# Patient Record
Sex: Female | Born: 1970 | Race: White | Hispanic: No | Smoking: Never smoker
Health system: Southern US, Community
[De-identification: ages and names within clinical notes are randomized; demographics above are authoritative.]

## PROBLEM LIST (undated history)

## (undated) DIAGNOSIS — Z789 Other specified health status: Secondary | ICD-10-CM

## (undated) HISTORY — PX: TUBAL LIGATION: SHX77

---

## 2015-06-07 DIAGNOSIS — M1990 Unspecified osteoarthritis, unspecified site: Secondary | ICD-10-CM | POA: Insufficient documentation

## 2015-06-07 DIAGNOSIS — F902 Attention-deficit hyperactivity disorder, combined type: Secondary | ICD-10-CM | POA: Insufficient documentation

## 2015-06-07 DIAGNOSIS — F5101 Primary insomnia: Secondary | ICD-10-CM | POA: Insufficient documentation

## 2015-10-13 ENCOUNTER — Other Ambulatory Visit: Payer: Self-pay | Admitting: Obstetrics and Gynecology

## 2015-10-13 DIAGNOSIS — Z1231 Encounter for screening mammogram for malignant neoplasm of breast: Secondary | ICD-10-CM

## 2015-10-18 ENCOUNTER — Ambulatory Visit: Admission: RE | Admit: 2015-10-18 | Payer: Self-pay | Source: Ambulatory Visit

## 2015-11-08 ENCOUNTER — Encounter
Admission: RE | Admit: 2015-11-08 | Discharge: 2015-11-08 | Disposition: A | Discharge status: Home / Self Care | Payer: BLUE CROSS/BLUE SHIELD | Source: Ambulatory Visit | Attending: Obstetrics and Gynecology | Admitting: Obstetrics and Gynecology

## 2015-11-08 DIAGNOSIS — N939 Abnormal uterine and vaginal bleeding, unspecified: Secondary | ICD-10-CM | POA: Diagnosis present

## 2015-11-08 DIAGNOSIS — N840 Polyp of corpus uteri: Secondary | ICD-10-CM | POA: Diagnosis present

## 2015-11-08 DIAGNOSIS — Z9851 Tubal ligation status: Secondary | ICD-10-CM | POA: Diagnosis not present

## 2015-11-08 DIAGNOSIS — Z800 Family history of malignant neoplasm of digestive organs: Secondary | ICD-10-CM | POA: Diagnosis not present

## 2015-11-08 DIAGNOSIS — Z833 Family history of diabetes mellitus: Secondary | ICD-10-CM | POA: Diagnosis not present

## 2015-11-08 DIAGNOSIS — N72 Inflammatory disease of cervix uteri: Secondary | ICD-10-CM | POA: Diagnosis not present

## 2015-11-08 LAB — BASIC METABOLIC PANEL
Anion gap: 6 (ref 5–15)
BUN: 12 mg/dL (ref 6–20)
CHLORIDE: 102 mmol/L (ref 101–111)
CO2: 26 mmol/L (ref 22–32)
Calcium: 8.8 mg/dL — ABNORMAL LOW (ref 8.9–10.3)
Creatinine, Ser: 0.71 mg/dL (ref 0.44–1.00)
GFR calc Af Amer: >60 mL/min (ref >60)
GFR calc non Af Amer: >60 mL/min (ref >60)
Glucose, Bld: 91 mg/dL (ref 65–99)
POTASSIUM: 3.8 mmol/L (ref 3.5–5.1)
SODIUM: 134 mmol/L — AB (ref 135–145)

## 2015-11-08 LAB — CBC
HEMATOCRIT: 39.6 % (ref 35.0–47.0)
Hemoglobin: 13.3 g/dL (ref 12.0–16.0)
MCH: 30.7 pg (ref 26.0–34.0)
MCHC: 33.5 g/dL (ref 32.0–36.0)
MCV: 91.7 fL (ref 80.0–100.0)
Platelets: 225 10*3/uL (ref 150–440)
RBC: 4.32 MIL/uL (ref 3.80–5.20)
RDW: 13.1 % (ref 11.5–14.5)
WBC: 7.9 10*3/uL (ref 3.6–11.0)

## 2015-11-08 LAB — TYPE AND SCREEN
ABO/RH(D): O POS
Antibody Screen: NEGATIVE

## 2015-11-08 LAB — PREGNANCY, URINE: Preg Test, Ur: NEGATIVE

## 2015-11-08 LAB — ABO/RH: ABO/RH(D): O POS

## 2015-11-08 NOTE — Patient Instructions (Signed)
  Your procedure is scheduled on:11/11/15 Report to Day Surgery. [facility] To find out your arrival time please call [phone] between 1PM - 3PM on 11/10/15  Remember: Instructions that are not followed completely may result in serious medical risk, up to and including death, or upon the discretion of your surgeon and anesthesiologist your surgery may need to be rescheduled.    __X__ 1. Do not eat food or drink liquids after midnight. No gum chewing or hard candies.     __X__ 2. No Alcohol for 24 hours before or after surgery.   ____ 3. Bring all medications with you on the day of surgery if instructed.    __X__ 4. Notify your doctor if there is any change in your medical condition     (cold, fever, infections).     Do not wear jewelry, make-up, hairpins, clips or nail polish.  Do not wear lotions, powders, or perfumes. You may wear deodorant.  Do not shave 48 hours prior to surgery. Men may shave face and neck.  Do not bring valuables to the hospital.    [facility] is not responsible for any belongings or valuables.               Contacts, dentures or bridgework may not be worn into surgery.  Leave your suitcase in the car. After surgery it may be brought to your room.  For patients admitted to the hospital, discharge time is determined by your                treatment team.   Patients discharged the day of surgery will not be allowed to drive home.   Please read over the following fact sheets that you were given:   Surgical Site Infection Prevention   ____ Take these medicines the morning of surgery with A SIP OF WATER:    1. NONE  2.   3.   4.  5.  6.  ____ Fleet Enema (as directed)   ____ Use CHG Soap as directed  ____ Use inhalers on the day of surgery  ____ Stop metformin 2 days prior to surgery    ____ Take 1/2 of usual insulin dose the night before surgery and none on the morning of surgery.   ____ Stop Coumadin/Plavix/aspirin on   ____  Stop Anti-inflammatories on    ____ Stop supplements until after surgery.    ____ Bring C-Pap to the hospital.

## 2015-11-08 NOTE — H&P (Signed)
  Ms. Denise Pratt is a 45 y.o. female here for Pre Op Consulting .  HPI:  Pt presents for a preoperative visit to schedule a D&C, hysteroscopy, and Novasure ablation for AUB failed medical management.  She has a hx of: BTL and polypectomy.  Workup has included: EMBx and TVUS, both normal   AUB - workup including normal EMBx and normal ultrasound. Has failed medical management, and desires permanent option.  Ut wnl Lt ov wnl Two rt simple ov cysts seen 1=1.09 cm 2=0.94 cm ES: 6.404mm  No fibroids  Hx of BTL Recent increase in moodiness, irritability. Some related to stopping her ADD meds, but she would like to trial an SSRI for the year to see if improvement.   Past Medical History:  has no past medical history on file.  Past Surgical History:  has a past surgical history that includes Tubal ligation and polypectomy. Family History: family history includes Colon cancer in her father; Diabetes mellitus in her father and paternal grandmother. Social History:  reports that she has never smoked. She does not have any smokeless tobacco history on file. She reports that she drinks alcohol. She reports that she does not use illicit drugs. OB/GYN History:  OB History    Gravida Para Term Preterm AB TAB SAB Ectopic Multiple Living   2 2 2       2       Allergies: has No Known Allergies. Medications:  Current Outpatient Prescriptions:  . sertraline (ZOLOFT) 50 MG tablet, Take 1 tablet (50 mg total) by mouth once daily., Disp: 30 tablet, Rfl: 11  Review of Systems: No SOB, no palpitations or chest pain, no new lower extremity edema, no nausea or vomiting or bowel or bladder complaints. See HPI for gyn specific ROS.  Exam:      Vitals:   11/08/15 1031  BP: 128/70    WDWN white female in NAD Body mass index is 33.3 kg/(m^2).  General: Patient is well-groomed, well-nourished, appears stated age in no acute distress  HEENT: head is atraumatic and  normocephalic, trachea is midline, neck is supple with no palpable nodules  CV: Regular rhythm and normal heart rate, no murmur  Pulm: Clear to auscultation throughout lung fields with no wheezing, crackles, or rhonchi. No increased work of breathing  Abdomen: soft , no mass, non-tender, no rebound tenderness, no hepatomegaly  Pelvic: tanner stage 5 ,  External genitalia: vulva /labia no lesions Urethra: no prolapse Vagina: normal physiologic d/c, laxity in vaginal walls Cervix: no lesions, no cervical motion tenderness, good descent Uterus: normal size shape and contour, non-tender Adnexa: no mass, non-tender  Rectovaginal: External wnl  Impression:   The primary encounter diagnosis was Abnormal uterine bleeding (AUB). A diagnosis of Encounter to establish care was also pertinent to this visit.    Plan:   - Preoperative visit: D&C hysteroscopy, Novasure ablation. Consents signed today. Risks of surgery were discussed with the patient including but not limited to: bleeding which may require transfusion; infection which may require antibiotics; injury to uterus or surrounding organs; intrauterine scarring which may impair future fertility; need for additional procedures including laparotomy or laparoscopy; and other postoperative/anesthesia complications. Written informed consent was obtained.  This is a scheduled same-day surgery. She will have a postop visit in 2 weeks to review operative findings and pathology.

## 2015-11-11 ENCOUNTER — Ambulatory Visit: Payer: BLUE CROSS/BLUE SHIELD | Admitting: Anesthesiology

## 2015-11-11 ENCOUNTER — Encounter: Payer: Self-pay | Admitting: *Deleted

## 2015-11-11 ENCOUNTER — Encounter
Admission: RE | Disposition: A | Discharge status: Home / Self Care | Payer: Self-pay | Source: Ambulatory Visit | Attending: Obstetrics and Gynecology

## 2015-11-11 ENCOUNTER — Ambulatory Visit
Admission: RE | Admit: 2015-11-11 | Discharge: 2015-11-11 | Disposition: A | Discharge status: Home / Self Care | Payer: BLUE CROSS/BLUE SHIELD | Source: Ambulatory Visit | Attending: Obstetrics and Gynecology | Admitting: Obstetrics and Gynecology

## 2015-11-11 DIAGNOSIS — N939 Abnormal uterine and vaginal bleeding, unspecified: Secondary | ICD-10-CM | POA: Insufficient documentation

## 2015-11-11 DIAGNOSIS — N72 Inflammatory disease of cervix uteri: Secondary | ICD-10-CM | POA: Diagnosis not present

## 2015-11-11 DIAGNOSIS — Z800 Family history of malignant neoplasm of digestive organs: Secondary | ICD-10-CM | POA: Insufficient documentation

## 2015-11-11 DIAGNOSIS — N840 Polyp of corpus uteri: Secondary | ICD-10-CM | POA: Insufficient documentation

## 2015-11-11 DIAGNOSIS — Z9851 Tubal ligation status: Secondary | ICD-10-CM | POA: Insufficient documentation

## 2015-11-11 DIAGNOSIS — Z833 Family history of diabetes mellitus: Secondary | ICD-10-CM | POA: Insufficient documentation

## 2015-11-11 HISTORY — PX: HYSTEROSCOPY WITH NOVASURE: SHX5574

## 2015-11-11 SURGERY — HYSTEROSCOPY WITH NOVASURE
Anesthesia: General | Wound class: Clean Contaminated

## 2015-11-11 MED ORDER — DEXAMETHASONE SODIUM PHOSPHATE 10 MG/ML IJ SOLN
INTRAMUSCULAR | Status: DC | PRN
  Administered 2015-11-11: 5 mg via INTRAVENOUS

## 2015-11-11 MED ORDER — LACTATED RINGERS IV SOLN
INTRAVENOUS | Status: DC
  Administered 2015-11-11: 07:00:00 via INTRAVENOUS

## 2015-11-11 MED ORDER — MIDAZOLAM HCL 2 MG/2ML IJ SOLN
INTRAMUSCULAR | Status: DC | PRN
  Administered 2015-11-11: 2 mg via INTRAVENOUS

## 2015-11-11 MED ORDER — OXYCODONE-ACETAMINOPHEN 5-325 MG PO TABS: 1.0000 | ORAL_TABLET | Freq: Four times a day (QID) | ORAL | Status: DC | PRN

## 2015-11-11 MED ORDER — PROPOFOL 10 MG/ML IV BOLUS
INTRAVENOUS | Status: DC | PRN
  Administered 2015-11-11: 150 mg via INTRAVENOUS

## 2015-11-11 MED ORDER — ONDANSETRON HCL 4 MG/2ML IJ SOLN: 4.0000 mg | Freq: Once | INTRAMUSCULAR | Status: DC | PRN

## 2015-11-11 MED ORDER — LIDOCAINE HCL (CARDIAC) 20 MG/ML IV SOLN
INTRAVENOUS | Status: DC | PRN
  Administered 2015-11-11: 100 mg via INTRAVENOUS

## 2015-11-11 MED ORDER — LACTATED RINGERS IV SOLN: INTRAVENOUS | Status: DC

## 2015-11-11 MED ORDER — DOCUSATE SODIUM 100 MG PO CAPS: 100.0000 mg | ORAL_CAPSULE | Freq: Two times a day (BID) | ORAL | Status: DC | PRN

## 2015-11-11 MED ORDER — ONDANSETRON HCL 4 MG/2ML IJ SOLN
INTRAMUSCULAR | Status: DC | PRN
  Administered 2015-11-11: 4 mg via INTRAVENOUS

## 2015-11-11 MED ORDER — FAMOTIDINE 20 MG PO TABS
20.0000 mg | ORAL_TABLET | Freq: Once | ORAL | Status: AC
  Administered 2015-11-11: 20 mg via ORAL

## 2015-11-11 MED ORDER — SILVER NITRATE-POT NITRATE 75-25 % EX MISC
CUTANEOUS | Status: AC
  Filled 2015-11-11: qty 7

## 2015-11-11 MED ORDER — IBUPROFEN 600 MG PO TABS: 600.0000 mg | ORAL_TABLET | Freq: Four times a day (QID) | ORAL | Status: DC | PRN

## 2015-11-11 MED ORDER — FAMOTIDINE 20 MG PO TABS
ORAL_TABLET | ORAL | Status: AC
  Filled 2015-11-11: qty 1

## 2015-11-11 MED ORDER — FENTANYL CITRATE (PF) 100 MCG/2ML IJ SOLN
25.0000 ug | INTRAMUSCULAR | Status: DC | PRN
  Administered 2015-11-11 (×4): 25 ug via INTRAVENOUS

## 2015-11-11 MED ORDER — EPHEDRINE SULFATE 50 MG/ML IJ SOLN
INTRAMUSCULAR | Status: DC | PRN
  Administered 2015-11-11: 10 mg via INTRAVENOUS

## 2015-11-11 MED ORDER — OXYCODONE-ACETAMINOPHEN 5-325 MG PO TABS
ORAL_TABLET | ORAL | Status: AC
  Administered 2015-11-11: 1 via ORAL
  Filled 2015-11-11: qty 1

## 2015-11-11 MED ORDER — FENTANYL CITRATE (PF) 100 MCG/2ML IJ SOLN
INTRAMUSCULAR | Status: DC | PRN
  Administered 2015-11-11: 25 ug via INTRAVENOUS
  Administered 2015-11-11: 50 ug via INTRAVENOUS
  Administered 2015-11-11: 25 ug via INTRAVENOUS

## 2015-11-11 MED ORDER — FENTANYL CITRATE (PF) 100 MCG/2ML IJ SOLN
INTRAMUSCULAR | Status: AC
  Administered 2015-11-11: 25 ug via INTRAVENOUS
  Filled 2015-11-11: qty 2

## 2015-11-11 MED ORDER — OXYCODONE-ACETAMINOPHEN 5-325 MG PO TABS
1.0000 | ORAL_TABLET | Freq: Four times a day (QID) | ORAL | Status: DC | PRN
  Administered 2015-11-11: 1 via ORAL

## 2015-11-11 SURGICAL SUPPLY — 15 items
CANISTER SUCT 1200ML W/VALVE (MISCELLANEOUS) ×2 IMPLANT
CATH ROBINSON RED A/P 16FR (CATHETERS) ×2 IMPLANT
GLOVE BIO SURGEON STRL SZ 6 (GLOVE) ×2 IMPLANT
GLOVE INDICATOR 6.5 STRL GRN (GLOVE) ×2 IMPLANT
GOWN STRL REUS W/ TWL LRG LVL3 (GOWN DISPOSABLE) ×2 IMPLANT
GOWN STRL REUS W/TWL LRG LVL3 (GOWN DISPOSABLE) ×2
IV LACTATED RINGERS 1000ML (IV SOLUTION) ×2 IMPLANT
KIT RM TURNOVER CYSTO AR (KITS) ×2 IMPLANT
NOVASURE ENDOMETRIAL ABLATION (MISCELLANEOUS) ×2 IMPLANT
NS IRRIG 500ML POUR BTL (IV SOLUTION) ×2 IMPLANT
PACK DNC HYST (MISCELLANEOUS) ×2 IMPLANT
PAD OB MATERNITY 4.3X12.25 (PERSONAL CARE ITEMS) ×2 IMPLANT
PAD PREP 24X41 OB/GYN DISP (PERSONAL CARE ITEMS) ×2 IMPLANT
TOWEL OR 17X26 4PK STRL BLUE (TOWEL DISPOSABLE) ×2 IMPLANT
TUBING CONNECTING 10 (TUBING) ×2 IMPLANT

## 2015-11-11 NOTE — Interval H&P Note (Signed)
History and Physical Interval Note:  11/11/2015 7:31 AM  Denise Pratt  has presented today for surgery, with the diagnosis of AUB  The various methods of treatment have been discussed with the patient and family. After consideration of risks, benefits and other options for treatment, the patient has consented to  Procedure(s): HYSTEROSCOPY WITH NOVASURE (N/A) and dilation and curettage as a surgical intervention .  The patient's history has been reviewed, patient examined, no change in status, stable for surgery.  I have reviewed the patient's chart and labs.  Questions were answered to the patient's satisfaction.     Christeen DouglasBEASLEY, Murphy Duzan

## 2015-11-11 NOTE — Discharge Instructions (Signed)
Discharge instructions after a hysteroscopy with dilation and curettage, Novasure ablation  Signs and Symptoms to Report  Call our office at [phone] if you have any of the following:    Fever over 100.4 degrees or higher  Severe stomach pain not relieved with pain medications  Bright red bleeding thats heavier than a period that does not slow with rest after the first 24 hours  To go the bathroom a lot (frequency), you cant hold your urine (urgency), or it hurts when you empty your bladder (urinate)  Chest pain  Shortness of breath  Pain in the calves of your legs  Severe nausea and vomiting not relieved with anti-nausea medications  Any concerns  What You Can Expect after Surgery  You may see some pink tinged, bloody fluid. This is normal. You may also have cramping for several days.   Activities after Your Discharge Follow these guidelines to help speed your recovery at home:  Dont drive if you are in pain or taking narcotic pain medicine. You may drive when you can safely slam on the brakes, turn the wheel forcefully, and rotate your torso comfortably. This is typically 4-7 days. Practice in a parking lot or side street prior to attempting to drive regularly.   Ask others to help with household chores for 4 weeks.  Dont do strenuous activities, exercises, or sports like vacuuming, tennis, squash, etc. until your doctor says it is safe to do so.  Walk as you feel able. Rest often since it may take a week or two for your energy level to return to normal.   You may climb stairs  Avoid constipation:   -Eat fruits, vegetables, and whole grains. Eat small meals as your appetite will take time to return to normal.   -Drink 6 to 8 glasses of water each day unless your doctor has told you to limit your fluids.   -Use a laxative or stool softener as needed if constipation becomes a problem. You may take Miralax, metamucil, Citrucil, Colace, Senekot, FiberCon, etc. If  this does not relieve the constipation, try two tablespoons of Milk Of Magnesia every 8 hours until your bowels move.   You may shower.   Do not get in a hot tub, swimming pool, etc. until your doctor agrees.  Do not douche, use tampons, or have sex until your doctor says it is okay, usually about 2 weeks.  Take your pain medicine when you need it. The medicine may not work as well if the pain is bad.  Take the medicines you were taking before surgery. Other medications you might need are pain medications (ibuprofen), medications for constipation (Colace) and nausea medications (Zofran).    AMBULATORY SURGERY  DISCHARGE INSTRUCTIONS   1) The drugs that you were given will stay in your system until tomorrow so for the next 24 hours you should not:  A) Drive an automobile B) Make any legal decisions C) Drink any alcoholic beverage   2) You may resume regular meals tomorrow.  Today it is better to start with liquids and gradually work up to solid foods.  You may eat anything you prefer, but it is better to start with liquids, then soup and crackers, and gradually work up to solid foods.   3) Please notify your doctor immediately if you have any unusual bleeding, trouble breathing, redness and pain at the surgery site, drainage, fever, or pain not relieved by medication.    4) Additional Instructions:  Additional Instructions: ° ° ° ° ° ° ° °Please contact your physician with any problems or Same Day Surgery at [phone], Monday through Friday 6 am to 4 pm, or [facility] number at [phone]. ° ° ° ° °

## 2015-11-11 NOTE — Anesthesia Procedure Notes (Signed)
Procedure Name: LMA Insertion Date/Time: 11/11/2015 7:50 AM Performed by: Irving BurtonBACHICH, Avigdor Dollar Pre-anesthesia Checklist: Patient identified, Emergency Drugs available, Suction available and Patient being monitored Patient Re-evaluated:Patient Re-evaluated prior to inductionOxygen Delivery Method: Circle system utilized Preoxygenation: Pre-oxygenation with 100% oxygen Intubation Type: IV induction Ventilation: Mask ventilation without difficulty LMA: LMA inserted LMA Size: 3.5 Number of attempts: 1 Airway Equipment and Method: Patient positioned with wedge pillow Placement Confirmation: breath sounds checked- equal and bilateral and positive ETCO2 Tube secured with: Tape Dental Injury: Teeth and Oropharynx as per pre-operative assessment

## 2015-11-11 NOTE — Anesthesia Preprocedure Evaluation (Signed)
Anesthesia Evaluation  Patient identified by MRN, date of birth, ID band Patient awake    Reviewed: Allergy & Precautions, NPO status , Patient's Chart, lab work & pertinent test results  Airway Mallampati: II  TM Distance: >3 FB Neck ROM: Full    Dental no notable dental hx.    Pulmonary neg pulmonary ROS,    Pulmonary exam normal        Cardiovascular negative cardio ROS Normal cardiovascular exam     Neuro/Psych negative neurological ROS  negative psych ROS   GI/Hepatic negative GI ROS, Neg liver ROS,   Endo/Other  negative endocrine ROS  Renal/GU negative Renal ROS  negative genitourinary   Musculoskeletal negative musculoskeletal ROS (+)   Abdominal Normal abdominal exam  (+)   Peds negative pediatric ROS (+)  Hematology negative hematology ROS (+)   Anesthesia Other Findings   Reproductive/Obstetrics negative OB ROS                             Anesthesia Physical Anesthesia Plan  ASA: II  Anesthesia Plan: General   Post-op Pain Management:    Induction: Intravenous  Airway Management Planned: LMA  Additional Equipment:   Intra-op Plan:   Post-operative Plan: Extubation in OR  Informed Consent: I have reviewed the patients History and Physical, chart, labs and discussed the procedure including the risks, benefits and alternatives for the proposed anesthesia with the patient or authorized representative who has indicated his/her understanding and acceptance.   Dental advisory given  Plan Discussed with: CRNA and Surgeon  Anesthesia Plan Comments:         Anesthesia Quick Evaluation

## 2015-11-11 NOTE — Transfer of Care (Signed)
Immediate Anesthesia Transfer of Care Note  Patient: Denise Pratt  Procedure(s) Performed: Procedure(s): HYSTEROSCOPY WITH NOVASURE (N/A)  Patient Location: PACU  Anesthesia Type:General  Level of Consciousness: awake  Airway & Oxygen Therapy: Patient Spontanous Breathing and Patient connected to face mask oxygen  Post-op Assessment: Report given to RN and Post -op Vital signs reviewed and stable  Post vital signs: stable  Last Vitals:  Filed Vitals:   11/11/15 0618 11/11/15 0831  BP: 121/79 116/70  Pulse: 71 82  Temp: 35.7 C 36.2 C  Resp: 17 16    Complications: No apparent anesthesia complications

## 2015-11-11 NOTE — Op Note (Signed)
Operative Report Hysteroscopy with Dilation and Curettage; Novasure ablation   Indications: Abnormal uterine bleeding,failed medical management   Pre-operative Diagnosis: AUB-O   Post-operative Diagnosis: same plus endometrial polyp.  Procedure: 1. Exam under anesthesia 2. Fractional D&C with endocervical curettage  3. Hysteroscopy 4. Polypectomy 5. Novasure endometrial ablation  Surgeon: Cline CoolsBethany E. Jeryl Umholtz, MD  Assistant(s):  None  Anesthesia: General endotracheal anesthesia  Anesthesiologist: Yves DillPaul Carroll, MD Anesthesiologist: Yves DillPaul Carroll, MD CRNA: Irving BurtonJennifer Bachich, CRNA  Estimated Blood Loss:  Minimal               Total IV Fluids: 600ml  Urine Output: 75ml         Specimens: Endocervical curettings, endometrial curettings         Complications:  None; patient tolerated the procedure well.         Disposition: PACU - hemodynamically stable.         Condition: stable  Findings: Uterus measuring 8 cm by sound; normal cervix, vagina, perineum. Cervical length: 4 cm Uterine cavity length: 4 cm Uterine cavity width: 3 cm Power in watts: 66 Total time: 120 sec  Indication for procedure/Consents: 45 y.o.  here for scheduled surgery for the aforementioned diagnoses.   Risks of surgery were discussed with the patient including but not limited to: bleeding which may require transfusion; infection which may require antibiotics; injury to uterus or surrounding organs; intrauterine scarring which may impair future fertility; need for additional procedures including laparotomy or laparoscopy; and other postoperative/anesthesia complications. Written informed consent was obtained.    Procedure Details:   The patient was taken to the operating room where GET anesthesia was administered and was found to be adequate. After a formal and adequate timeout was performed, she was placed in the dorsal lithotomy position and examined with the above findings. She was then prepped and  draped in the sterile manner. Her bladder was catheterized for an estimated amount of clear, yellow urine. A weighed speculum was then placed in the patient's vagina and a single tooth tenaculum was applied to the anterior lip of the cervix.  An endocervical currettage was performed. Her cervix was serially dilated to 15 [place] using Hanks dilators.The hysteroscope was introduced to reveal the above findings.The hysteroscope was also used to determine the level of the internal os, and measurements were confirmed. The uterine cavity was carefully examined, both ostia were recognized, and diffusely proliferative endometrium with polypoid fragments was noted.  A sharp curettage was then performed until there was a gritty texture in all four quadrants.   NOVASURE PROCEDURE DETAILS:   The cervix was further dilated to accommodate the NovaSure device.  The NovaSure device was inserted, and the cavity width was determined. Using a power of 66 watts, for 120 sec, the endometrial ablation was performed. The hysteroscope was not re-introduced into the uterine cavity, to decrease the risk of pelvic infection. The tenaculum was removed from the anterior lip of the cervix, and the vaginal speculum was removed after noting good hemostasis.  She received iv acetaminophen and Toradol prior to leaving the OR. The patient tolerated the procedure well and was taken to the recovery area awake, extubated and in stable condition.  The patient will be discharged to home as per PACU criteria.  Routine postoperative instructions given.  She was prescribed Percocet, Ibuprofen and Colace.  She will follow up in the clinic in two weeks for postoperative evaluation.

## 2015-11-12 NOTE — Anesthesia Postprocedure Evaluation (Signed)
Anesthesia Post Note  Patient: Denise Pratt  Procedure(s) Performed: Procedure(s) (LRB): HYSTEROSCOPY WITH NOVASURE (N/A)  Patient location during evaluation: PACU Anesthesia Type: General Level of consciousness: awake and alert and oriented Pain management: pain level controlled Vital Signs Assessment: post-procedure vital signs reviewed and stable Respiratory status: spontaneous breathing Cardiovascular status: blood pressure returned to baseline Anesthetic complications: no    Last Vitals:  Filed Vitals:   11/11/15 0924 11/11/15 1014  BP: 117/79 133/69  Pulse: 63 83  Temp: 36.6 C   Resp: 16 16    Last Pain:  Filed Vitals:   11/11/15 1020  PainSc: 4                  Edel Rivero

## 2015-11-14 LAB — SURGICAL PATHOLOGY

## 2016-05-24 ENCOUNTER — Other Ambulatory Visit: Payer: Self-pay | Admitting: Orthopedic Surgery

## 2016-07-31 ENCOUNTER — Ambulatory Visit
Admission: EM | Admit: 2016-07-31 | Discharge: 2016-07-31 | Disposition: A | Discharge status: Home / Self Care | Payer: BLUE CROSS/BLUE SHIELD | Attending: Family Medicine | Admitting: Family Medicine

## 2016-07-31 ENCOUNTER — Ambulatory Visit
Admit: 2016-07-31 | Discharge: 2016-07-31 | Disposition: A | Discharge status: Home / Self Care | Payer: BLUE CROSS/BLUE SHIELD | Attending: Family Medicine | Admitting: Family Medicine

## 2016-07-31 ENCOUNTER — Other Ambulatory Visit: Payer: Self-pay

## 2016-07-31 DIAGNOSIS — K8020 Calculus of gallbladder without cholecystitis without obstruction: Secondary | ICD-10-CM | POA: Insufficient documentation

## 2016-07-31 DIAGNOSIS — R1010 Upper abdominal pain, unspecified: Secondary | ICD-10-CM | POA: Diagnosis not present

## 2016-07-31 DIAGNOSIS — Z711 Person with feared health complaint in whom no diagnosis is made: Secondary | ICD-10-CM

## 2016-07-31 DIAGNOSIS — R1011 Right upper quadrant pain: Secondary | ICD-10-CM | POA: Insufficient documentation

## 2016-07-31 DIAGNOSIS — N939 Abnormal uterine and vaginal bleeding, unspecified: Secondary | ICD-10-CM | POA: Insufficient documentation

## 2016-07-31 HISTORY — DX: Other specified health status: Z78.9

## 2016-07-31 LAB — COMPREHENSIVE METABOLIC PANEL
ALT: 27 U/L (ref 14–54)
AST: 21 U/L (ref 15–41)
Albumin: 4.1 g/dL (ref 3.5–5.0)
Alkaline Phosphatase: 81 U/L (ref 38–126)
Anion gap: 9 (ref 5–15)
BUN: 13 mg/dL (ref 6–20)
CHLORIDE: 99 mmol/L — AB (ref 101–111)
CO2: 26 mmol/L (ref 22–32)
CREATININE: 0.64 mg/dL (ref 0.44–1.00)
Calcium: 9.2 mg/dL (ref 8.9–10.3)
GFR calc Af Amer: >60 mL/min (ref >60)
Glucose, Bld: 96 mg/dL (ref 65–99)
Potassium: 4.1 mmol/L (ref 3.5–5.1)
Sodium: 134 mmol/L — ABNORMAL LOW (ref 135–145)
Total Bilirubin: 0.5 mg/dL (ref 0.3–1.2)
Total Protein: 7.5 g/dL (ref 6.5–8.1)

## 2016-07-31 LAB — CBC WITH DIFFERENTIAL/PLATELET
Basophils Absolute: 0.1 10*3/uL (ref 0–0.1)
Basophils Relative: 1 %
Eosinophils Absolute: 0.8 10*3/uL — ABNORMAL HIGH (ref 0–0.7)
Eosinophils Relative: 8 %
HEMATOCRIT: 40.7 % (ref 35.0–47.0)
HEMOGLOBIN: 13.6 g/dL (ref 12.0–16.0)
LYMPHS ABS: 2.8 10*3/uL (ref 1.0–3.6)
LYMPHS PCT: 27 %
MCH: 30.3 pg (ref 26.0–34.0)
MCHC: 33.3 g/dL (ref 32.0–36.0)
MCV: 91.2 fL (ref 80.0–100.0)
MONOS PCT: 6 %
Monocytes Absolute: 0.6 10*3/uL (ref 0.2–0.9)
NEUTROS ABS: 6 10*3/uL (ref 1.4–6.5)
NEUTROS PCT: 58 %
Platelets: 216 10*3/uL (ref 150–440)
RBC: 4.47 MIL/uL (ref 3.80–5.20)
RDW: 13.3 % (ref 11.5–14.5)
WBC: 10.4 10*3/uL (ref 3.6–11.0)

## 2016-07-31 LAB — AMYLASE: Amylase: 59 U/L (ref 28–100)

## 2016-07-31 LAB — LIPASE, BLOOD: LIPASE: 26 U/L (ref 11–51)

## 2016-07-31 MED ORDER — ONDANSETRON 8 MG PO TBDP
8.0000 mg | ORAL_TABLET | Freq: Once | ORAL | Status: AC
  Administered 2016-07-31: 8 mg via ORAL

## 2016-07-31 MED ORDER — SUCRALFATE 1 G PO TABS: 2.0000 g | ORAL_TABLET | Freq: Two times a day (BID) | ORAL | 0 refills | Status: DC

## 2016-07-31 NOTE — ED Provider Notes (Addendum)
[facility]    CSN: [id number] Arrival date & time: 07/31/16  [id number]     History   Chief Complaint Chief Complaint  Patient presents with  . Abdominal Pain    HPI Denise Pratt is a 45 y.o. female.   Patient is here because of abdominal pain in the right upper quadrant. She has had recurrent abdominal pain in the right upper quadrant now when she lays on the right side it hurts. This has been ongoing for several weeks and she states that last Thursday he got worse with nausea vomiting and diarrhea. She states there vomiting she rested didn't eat much Friday she started eating again what she thought was a bland diet and started having abdominal pain again. Now she weighs the right side she hurts. Her bland diet included chicken soups with vegetables but they did have meat products with them. She states she never smoked. She's had a hysteroscopy or ablation. She's had a tubal ligation. Her father has diabetes. She is gravida 2 para 2 ABO 0. She denies any current medical problems. No known drug allergies   The history is provided by the patient. No language interpreter was used.  Abdominal Pain  Pain location:  RUQ Pain quality: aching, cramping, fullness, heavy, pressure, shooting, stabbing and throbbing   Pain radiates to:  RUQ Pain severity:  Severe Onset quality:  Sudden Timing:  Constant Progression:  Worsening Chronicity:  Recurrent Context: awakening from sleep, diet changes, eating and previous surgery   Context: not alcohol use, not medication withdrawal, not recent illness, not recent travel, not retching, not sick contacts, not suspicious food intake and not trauma   Relieved by:  Nothing Worsened by:  Eating Ineffective treatments: dietary changes. Associated symptoms: belching   Associated symptoms: no constipation, no shortness of breath, no vaginal bleeding and no vaginal discharge   Risk factors: has not had multiple surgeries and not pregnant     Past  Medical History:  Diagnosis Date  . Patient denies medical problems     There are no active problems to display for this patient.   Past Surgical History:  Procedure Laterality Date  . HYSTEROSCOPY WITH NOVASURE N/A 11/11/2015   Procedure: HYSTEROSCOPY WITH NOVASURE;  Surgeon: Christeen DouglasBethany Beasley, MD;  Location: [facility] ORS;  Service: Gynecology;  Laterality: N/A;  . TUBAL LIGATION      OB History    No data available       Home Medications    Prior to Admission medications   Medication Sig Start Date End Date Taking? Authorizing Provider  docusate sodium (COLACE) 100 MG capsule Take 1 capsule (100 mg total) by mouth 2 (two) times daily as needed. 11/11/15   Christeen DouglasBethany Beasley, MD  ibuprofen (ADVIL,MOTRIN) 600 MG tablet Take 1 tablet (600 mg total) by mouth every 6 (six) hours as needed. 11/11/15   Christeen DouglasBethany Beasley, MD  oxyCODONE-acetaminophen (PERCOCET/ROXICET) 5-325 MG tablet Take 1-2 tablets by mouth every 6 (six) hours as needed. 11/11/15   Christeen DouglasBethany Beasley, MD  sucralfate (CARAFATE) 1 g tablet Take 2 tablets (2 g total) by mouth 2 (two) times daily. An hour before eating. 07/31/16   Hassan RowanEugene Keashia Haskins, MD    Family History History reviewed. No pertinent family history.  Social History Social History  Substance Use Topics  . Smoking status: Never Smoker  . Smokeless tobacco: Never Used  . Alcohol use Yes     Comment: occ     Allergies   Patient has no known allergies.  Review of Systems Review of Systems  Respiratory: Negative for shortness of breath.   Gastrointestinal: Positive for abdominal pain. Negative for constipation.  Genitourinary: Negative for vaginal bleeding and vaginal discharge.  All other systems reviewed and are negative.    Physical Exam Triage Vital Signs ED Triage Vitals  Enc Vitals Group     BP 07/31/16 1011 118/83     Pulse Rate 07/31/16 1011 (!) 55     Resp 07/31/16 1011 18     Temp 07/31/16 1011 98.6 F (37 C)     Temp Source 07/31/16 1011 Oral       SpO2 07/31/16 1011 100 %     Weight 07/31/16 1012 173 lb (78.5 kg)     Height 07/31/16 1012 5\' 3"  (1.6 m)     Head Circumference --      Peak Flow --      Pain Score 07/31/16 1013 7     Pain Loc --      Pain Edu? --      Excl. in GC? --    No data found.   Updated Vital Signs BP 118/83 (BP Location: Right Arm)   Pulse (!) 55   Temp 98.6 F (37 C) (Oral)   Resp 18   Ht 5\' 3"  (1.6 m)   Wt 173 lb (78.5 kg)   SpO2 100%   BMI 30.65 kg/m   Visual Acuity Right Eye Distance:   Left Eye Distance:   Bilateral Distance:    Right Eye Near:   Left Eye Near:    Bilateral Near:     Physical Exam  Constitutional: She is oriented to person, place, and time. She appears well-developed and well-nourished.  HENT:  Head: Normocephalic and atraumatic.  Eyes: Pupils are equal, round, and reactive to light.  Neck: Normal range of motion. Neck supple.  Cardiovascular: Normal rate, regular rhythm and normal heart sounds.   Pulmonary/Chest: Effort normal and breath sounds normal.  Abdominal: Soft. Bowel sounds are normal. She exhibits no distension. There is hepatosplenomegaly and hepatomegaly. There is no splenomegaly. There is tenderness in the right upper quadrant. No hernia.    Musculoskeletal: Normal range of motion.  Neurological: She is oriented to person, place, and time.  Skin: Skin is dry.  Psychiatric: She has a normal mood and affect.  Vitals reviewed.    UC Treatments / Results  Labs (all labs ordered are listed, but only abnormal results are displayed) Labs Reviewed  CBC WITH DIFFERENTIAL/PLATELET - Abnormal; Notable for the following:       Result Value   Eosinophils Absolute 0.8 (*)    All other components within normal limits  COMPREHENSIVE METABOLIC PANEL - Abnormal; Notable for the following:    Sodium 134 (*)    Chloride 99 (*)    All other components within normal limits  AMYLASE  LIPASE, BLOOD    EKG  EKG Interpretation None        Radiology [place] Abdomen Limited Ruq  Result Date: 07/31/2016 CLINICAL DATA:  Right upper quadrant pain for 4-5 months EXAM: US ABDOMEN LIMITED - RIGHT UPPER QUADRANT COMPARISON:  None. FINDINGS: Gallbladder: The gallbladder is visualized and there is a large calculus of 1.5 cm which is not mobile, remaining in the gallbladder neck. There is no pain over the gallbladder currently and the gallbladder wall is not thickened. No pericholecystic fluid is seen. Common bile duct: Diameter: The common bile duct is normal measuring 3 mm in diameter. Liver: The echogenicity  of the liver parenchyma is slightly increased suggesting mild fatty infiltration. No focal hepatic abnormality is noted. IMPRESSION: 1. 1.5 cm gallstone remains lodged in the gallbladder neck. No present ultrasound evidence of acute cholecystitis is seen. Correlate clinically. 2. Echogenic liver parenchyma consistent with fatty infiltrationrate Electronically Signed   By: Dwyane Dee M.D.   On: 07/31/2016 14:09    Procedures Procedures (including critical care time)  Medications Ordered in UC Medications  ondansetron (ZOFRAN-ODT) disintegrating tablet 8 mg (8 mg Oral Given 07/31/16 1137)     Initial Impression / Assessment and Plan / UC Course  I have reviewed the triage vital signs and the nursing notes.  Pertinent labs & imaging results that were available during my care of the patient were reviewed by me and considered in my medical decision making (see chart for details). Results for orders placed or performed during the hospital encounter of 07/31/16  CBC with Differential  Result Value Ref Range   WBC 10.4 3.6 - 11.0 K/uL   RBC 4.47 3.80 - 5.20 MIL/uL   Hemoglobin 13.6 12.0 - 16.0 g/dL   HCT 96.2 95.2 - 84.1 %   MCV 91.2 80.0 - 100.0 fL   MCH 30.3 26.0 - 34.0 pg   MCHC 33.3 32.0 - 36.0 g/dL   RDW 32.4 40.1 - 02.7 %   Platelets 216 150 - 440 K/uL   Neutrophils Relative % 58 %   Neutro Abs 6.0 1.4 - 6.5 K/uL    Lymphocytes Relative 27 %   Lymphs Abs 2.8 1.0 - 3.6 K/uL   Monocytes Relative 6 %   Monocytes Absolute 0.6 0.2 - 0.9 K/uL   Eosinophils Relative 8 %   Eosinophils Absolute 0.8 (H) 0 - 0.7 K/uL   Basophils Relative 1 %   Basophils Absolute 0.1 0 - 0.1 K/uL  Amylase  Result Value Ref Range   Amylase 59 28 - 100 U/L  Lipase, blood  Result Value Ref Range   Lipase 26 11 - 51 U/L  Comprehensive metabolic panel  Result Value Ref Range   Sodium 134 (L) 135 - 145 mmol/L   Potassium 4.1 3.5 - 5.1 mmol/L   Chloride 99 (L) 101 - 111 mmol/L   CO2 26 22 - 32 mmol/L   Glucose, Bld 96 65 - 99 mg/dL   BUN 13 6 - 20 mg/dL   Creatinine, Ser 2.53 0.44 - 1.00 mg/dL   Calcium 9.2 8.9 - 66.4 mg/dL   Total Protein 7.5 6.5 - 8.1 g/dL   Albumin 4.1 3.5 - 5.0 g/dL   AST 21 15 - 41 U/L   ALT 27 14 - 54 U/L   Alkaline Phosphatase 81 38 - 126 U/L   Total Bilirubin 0.5 0.3 - 1.2 mg/dL   GFR calc non Af Amer >60 >60 mL/min   GFR calc Af Amer >60 >60 mL/min   Anion gap 9 5 - 15   Clinical Course    Patient was explained her lab works were normal. We'll place on Carafate 2 tablets ice and an empty stomach an hour before eating place on a bland diet and will work to try to get scheduled for ultrasound today now stat while she still hears [facility] to be discharged and sent to the waiting room follow-up with PCP of choice if not better if stones are sewn will need to get a surgical referral  Final Clinical Impressions(s) / UC Diagnoses   Final diagnoses:  Right upper quadrant abdominal  pain  Pain of upper abdomen  Concern about gallstones without diagnosis    New Prescriptions Discharge Medication List as of 07/31/2016 12:59 PM    START taking these medications   Details  sucralfate (CARAFATE) 1 g tablet Take 2 tablets (2 g total) by mouth 2 (two) times daily. An hour before eating., Starting Tue 07/31/2016, Normal        Note: This dictation was prepared with Dragon dictation along with  smaller phrase technology. Any transcriptional errors that result from this process are unintentional.   Hassan Rowan, MD 07/31/16 1304  Patient was informed that her gallbladder does have a stone best in the common duct. Recommend she continue using the Carafate will try to get to [facility] surgical tomorrow for evaluation I did warn her that she may have to also see the GI person as well. At this point time recommend strongly that she follow bland diet.   Note: This dictation was prepared with Dragon dictation along with smaller phrase technology. Any transcriptional errors that result from this process are unintentional.   Hassan Rowan, MD 07/31/16 1420

## 2016-07-31 NOTE — ED Triage Notes (Signed)
RUQ abdominal pain starting last Thursday. Vomited x Thursday and Friday. Diarrhea on Sunday. Now with nausea associated with meals. Pain 7/10

## 2016-08-01 ENCOUNTER — Ambulatory Visit (INDEPENDENT_AMBULATORY_CARE_PROVIDER_SITE_OTHER): Payer: BLUE CROSS/BLUE SHIELD | Admitting: General Surgery

## 2016-08-01 ENCOUNTER — Encounter: Payer: Self-pay | Admitting: Radiology

## 2016-08-01 ENCOUNTER — Observation Stay
Admission: AD | Admit: 2016-08-01 | Discharge: 2016-08-03 | Disposition: A | Discharge status: Home / Self Care | Payer: BLUE CROSS/BLUE SHIELD | Source: Ambulatory Visit | Attending: General Surgery | Admitting: General Surgery

## 2016-08-01 ENCOUNTER — Observation Stay: Payer: BLUE CROSS/BLUE SHIELD

## 2016-08-01 ENCOUNTER — Encounter: Payer: Self-pay | Admitting: General Surgery

## 2016-08-01 ENCOUNTER — Telehealth: Payer: Self-pay

## 2016-08-01 VITALS — BP 127/87 | HR 72 | Temp 97.8°F | Ht 63.0 in | Wt 198.0 lb

## 2016-08-01 DIAGNOSIS — K810 Acute cholecystitis: Secondary | ICD-10-CM | POA: Diagnosis present

## 2016-08-01 DIAGNOSIS — Z800 Family history of malignant neoplasm of digestive organs: Secondary | ICD-10-CM | POA: Diagnosis not present

## 2016-08-01 DIAGNOSIS — R109 Unspecified abdominal pain: Secondary | ICD-10-CM

## 2016-08-01 DIAGNOSIS — E669 Obesity, unspecified: Secondary | ICD-10-CM | POA: Diagnosis not present

## 2016-08-01 DIAGNOSIS — Z6835 Body mass index (BMI) 35.0-35.9, adult: Secondary | ICD-10-CM | POA: Diagnosis not present

## 2016-08-01 DIAGNOSIS — R1031 Right lower quadrant pain: Secondary | ICD-10-CM

## 2016-08-01 DIAGNOSIS — Z833 Family history of diabetes mellitus: Secondary | ICD-10-CM | POA: Insufficient documentation

## 2016-08-01 DIAGNOSIS — Z825 Family history of asthma and other chronic lower respiratory diseases: Secondary | ICD-10-CM | POA: Diagnosis not present

## 2016-08-01 DIAGNOSIS — K219 Gastro-esophageal reflux disease without esophagitis: Secondary | ICD-10-CM | POA: Insufficient documentation

## 2016-08-01 DIAGNOSIS — K8010 Calculus of gallbladder with chronic cholecystitis without obstruction: Secondary | ICD-10-CM | POA: Diagnosis not present

## 2016-08-01 DIAGNOSIS — Z8249 Family history of ischemic heart disease and other diseases of the circulatory system: Secondary | ICD-10-CM | POA: Insufficient documentation

## 2016-08-01 DIAGNOSIS — K819 Cholecystitis, unspecified: Secondary | ICD-10-CM | POA: Diagnosis present

## 2016-08-01 DIAGNOSIS — Z823 Family history of stroke: Secondary | ICD-10-CM | POA: Insufficient documentation

## 2016-08-01 LAB — CBC WITH DIFFERENTIAL/PLATELET
BASOS PCT: 1 %
Basophils Absolute: 0.1 10*3/uL (ref 0–0.1)
EOS ABS: 0.3 10*3/uL (ref 0–0.7)
Eosinophils Relative: 3 %
HCT: 38.6 % (ref 35.0–47.0)
HEMOGLOBIN: 13.2 g/dL (ref 12.0–16.0)
Lymphocytes Relative: 26 %
Lymphs Abs: 2.7 10*3/uL (ref 1.0–3.6)
MCH: 31.4 pg (ref 26.0–34.0)
MCHC: 34.1 g/dL (ref 32.0–36.0)
MCV: 92 fL (ref 80.0–100.0)
Monocytes Absolute: 0.6 10*3/uL (ref 0.2–0.9)
Monocytes Relative: 6 %
NEUTROS PCT: 64 %
Neutro Abs: 6.6 10*3/uL — ABNORMAL HIGH (ref 1.4–6.5)
Platelets: 191 10*3/uL (ref 150–440)
RBC: 4.2 MIL/uL (ref 3.80–5.20)
RDW: 13.4 % (ref 11.5–14.5)
WBC: 10.3 10*3/uL (ref 3.6–11.0)

## 2016-08-01 LAB — PREGNANCY, URINE: PREG TEST UR: NEGATIVE

## 2016-08-01 LAB — COMPREHENSIVE METABOLIC PANEL
ALBUMIN: 3.6 g/dL (ref 3.5–5.0)
ALK PHOS: 68 U/L (ref 38–126)
ALT: 20 U/L (ref 14–54)
AST: 23 U/L (ref 15–41)
Anion gap: 6 (ref 5–15)
BUN: 15 mg/dL (ref 6–20)
CALCIUM: 8.5 mg/dL — AB (ref 8.9–10.3)
CO2: 26 mmol/L (ref 22–32)
Chloride: 104 mmol/L (ref 101–111)
Creatinine, Ser: 0.73 mg/dL (ref 0.44–1.00)
GFR calc Af Amer: >60 mL/min (ref >60)
GFR calc non Af Amer: >60 mL/min (ref >60)
GLUCOSE: 121 mg/dL — AB (ref 65–99)
Potassium: 3.4 mmol/L — ABNORMAL LOW (ref 3.5–5.1)
SODIUM: 136 mmol/L (ref 135–145)
Total Bilirubin: 0.6 mg/dL (ref 0.3–1.2)
Total Protein: 6.8 g/dL (ref 6.5–8.1)

## 2016-08-01 LAB — PROTIME-INR
INR: 1.02
Prothrombin Time: 13.4 seconds (ref 11.4–15.2)

## 2016-08-01 LAB — APTT: aPTT: 29 seconds (ref 24–36)

## 2016-08-01 LAB — SURGICAL PCR SCREEN
MRSA, PCR: POSITIVE — AB
Staphylococcus aureus: POSITIVE — AB

## 2016-08-01 MED ORDER — PANTOPRAZOLE SODIUM 40 MG IV SOLR
40.0000 mg | Freq: Every day | INTRAVENOUS | Status: DC
  Administered 2016-08-01 – 2016-08-02 (×2): 40 mg via INTRAVENOUS
  Filled 2016-08-01 (×2): qty 40

## 2016-08-01 MED ORDER — DEXTROSE 5 % IV SOLN
2.0000 g | INTRAVENOUS | Status: DC
  Administered 2016-08-01: 2 g via INTRAVENOUS
  Filled 2016-08-01 (×2): qty 2

## 2016-08-01 MED ORDER — DIPHENHYDRAMINE HCL 25 MG PO CAPS: 25.0000 mg | ORAL_CAPSULE | Freq: Four times a day (QID) | ORAL | Status: DC | PRN

## 2016-08-01 MED ORDER — CHLORHEXIDINE GLUCONATE CLOTH 2 % EX PADS
6.0000 | MEDICATED_PAD | Freq: Every day | CUTANEOUS | Status: DC
  Administered 2016-08-02: 6 via TOPICAL

## 2016-08-01 MED ORDER — LACTATED RINGERS IV SOLN
INTRAVENOUS | Status: DC
  Administered 2016-08-01: 1000 mL via INTRAVENOUS
  Administered 2016-08-02 (×2): via INTRAVENOUS

## 2016-08-01 MED ORDER — IOPAMIDOL (ISOVUE-300) INJECTION 61%
15.0000 mL | INTRAVENOUS | Status: AC
  Administered 2016-08-01 (×2): 15 mL via ORAL

## 2016-08-01 MED ORDER — INFLUENZA VAC SPLIT QUAD 0.5 ML IM SUSY
0.5000 mL | PREFILLED_SYRINGE | INTRAMUSCULAR | Status: AC
  Administered 2016-08-02: 0.5 mL via INTRAMUSCULAR
  Filled 2016-08-01: qty 0.5

## 2016-08-01 MED ORDER — IOPAMIDOL (ISOVUE-370) INJECTION 76%
75.0000 mL | Freq: Once | INTRAVENOUS | Status: AC | PRN
  Administered 2016-08-01: 75 mL via INTRAVENOUS

## 2016-08-01 MED ORDER — MORPHINE SULFATE (PF) 4 MG/ML IV SOLN
4.0000 mg | INTRAVENOUS | Status: DC | PRN
  Administered 2016-08-01 – 2016-08-02 (×4): 4 mg via INTRAVENOUS
  Filled 2016-08-01 (×4): qty 1

## 2016-08-01 MED ORDER — ONDANSETRON HCL 4 MG/2ML IJ SOLN
4.0000 mg | Freq: Four times a day (QID) | INTRAMUSCULAR | Status: DC | PRN
  Administered 2016-08-01: 4 mg via INTRAVENOUS
  Filled 2016-08-01: qty 2

## 2016-08-01 MED ORDER — CHLORHEXIDINE GLUCONATE CLOTH 2 % EX PADS
6.0000 | MEDICATED_PAD | Freq: Once | CUTANEOUS | Status: AC
  Administered 2016-08-02: 6 via TOPICAL

## 2016-08-01 MED ORDER — CHLORHEXIDINE GLUCONATE CLOTH 2 % EX PADS: 6.0000 | MEDICATED_PAD | Freq: Once | CUTANEOUS | Status: DC

## 2016-08-01 MED ORDER — MUPIROCIN 2 % EX OINT
1.0000 "application " | TOPICAL_OINTMENT | Freq: Two times a day (BID) | CUTANEOUS | Status: DC
  Administered 2016-08-01 – 2016-08-02 (×3): 1 via NASAL
  Filled 2016-08-01: qty 22

## 2016-08-01 MED ORDER — HYDRALAZINE HCL 20 MG/ML IJ SOLN: 10.0000 mg | INTRAMUSCULAR | Status: DC | PRN

## 2016-08-01 MED ORDER — DIPHENHYDRAMINE HCL 50 MG/ML IJ SOLN: 25.0000 mg | Freq: Four times a day (QID) | INTRAMUSCULAR | Status: DC | PRN

## 2016-08-01 MED ORDER — ONDANSETRON 4 MG PO TBDP: 4.0000 mg | ORAL_TABLET | Freq: Four times a day (QID) | ORAL | Status: DC | PRN

## 2016-08-01 NOTE — Progress Notes (Signed)
Patient ID: Denise Pratt, female   DOB: 1970-11-17, 45 y.o.   MRN: [id number]  CC: ABDOMINAL PAIN  HPI Denise Pratt is a 45 y.o. female who presents to clinic today for evaluation of abdominal pain. Patient states she was seen in the urgent care Center yesterday with right upper quadrant abdominal pain and was found to have a gallstone. There was no statement about cholecystitis and she was discharged home without any treatment. Patient reports that since that time her pain has continued to worsen or she has not able to sleep or eat anything without horrible nausea and vomiting. Her last full meal was 3 days ago. She's been able to keep down some liquids but all intake causes nausea. She denies any fevers but has had chills. She denies chest pain, shortness of breath, diarrhea, constipation. She states she's had some similar right upper quadrant pains in the past but thought there were A never this bad. She reports that she is been miserable and strongly desires something to be done.  HPI  Past Medical History:  Diagnosis Date  . Patient denies medical problems     Past Surgical History:  Procedure Laterality Date  . HYSTEROSCOPY WITH NOVASURE N/A 11/11/2015   Procedure: HYSTEROSCOPY WITH NOVASURE;  Surgeon: Christeen DouglasBethany Beasley, MD;  Location: [facility] ORS;  Service: Gynecology;  Laterality: N/A;  . TUBAL LIGATION      Family History  Problem Relation Age of Onset  . COPD Mother   . Colon cancer Father   . Prostate cancer Father   . Diabetes Father   . Hypertension Father   . Heart attack Sister   . Stroke Maternal Grandmother   . Hypertension Maternal Grandmother     Social History Social History  Substance Use Topics  . Smoking status: Never Smoker  . Smokeless tobacco: Never Used  . Alcohol use Yes     Comment: occ    No Known Allergies  Current Outpatient Prescriptions  Medication Sig Dispense Refill  . sucralfate (CARAFATE) 1 g tablet Take 2 tablets (2 g total) by mouth 2 (two)  times daily. An hour before eating. 120 tablet 0   No current facility-administered medications for this visit.      Review of Systems A Multi-point review of systems was asked and was negative except for the findings documented in the history of present illness  Physical Exam Blood pressure 127/87, pulse 72, temperature 97.8 F (36.6 C), temperature source Oral, height 5\' 3"  (1.6 m), weight 89.8 kg (198 lb). CONSTITUTIONAL: No acute distress but obvious discomfort. EYES: Pupils are equal, round, and reactive to light, Sclera are non-icteric. EARS, NOSE, MOUTH AND THROAT: The oropharynx is clear. The oral mucosa is pink and moist. Hearing is intact to voice. LYMPH NODES:  Lymph nodes in the neck are normal. RESPIRATORY:  Lungs are clear. There is normal respiratory effort, with equal breath sounds bilaterally, and without pathologic use of accessory muscles. CARDIOVASCULAR: Heart is regular without murmurs, gallops, or rubs. GI: The abdomen is soft, tender to palpation in the right upper quadrant and bilateral lower quadrants but with the positive, and nondistended. There are no palpable masses. There is no hepatosplenomegaly. There are normal bowel sounds in all quadrants. GU: Rectal deferred.   MUSCULOSKELETAL: Normal muscle strength and tone. No cyanosis or edema.   SKIN: Turgor is good and there are no pathologic skin lesions or ulcers. NEUROLOGIC: Motor and sensation is grossly normal. Cranial nerves are grossly intact. PSYCH:  Oriented to person,  place and time. Affect is normal.  Data Reviewed Images and labs reviewed. Ultrasound right upper quadrant does show a large gallstone without gallbladder wall thickening or pericolic cystic fluid. Labs are all within normal limits with a white blood cell count 10.4, normal transaminases. I have personally reviewed the patient's imaging, laboratory findings and medical records.    Assessment    Abdominal pain, likely cholecystitis     Plan    45 year old female with progressively worsening right upper quadrant abdominal pain. She also has bilateral lower abdominal pains today but these are nothing compared to her upper quadrant pain. She was found to have gallstones yesterday. Given the positive Murphy sign and her obvious discomfort offered her direct admission for further workup and likely urgent surgical treatment. She voiced understanding and agreement. I will direct admit her to the hospital with plans for repeat labs, IV hydration, IV pain medications, CT scan of the abdomen and pelvis. She'll be seen by my partners when she is in the hospital to further complete her treatment. She'll likely undergo a laparoscopic cholecystectomy within the next 24-48 hours barring any unforeseen diagnoses being made.     Time spent with the patient was 45 minutes, with more than 50% of the time spent in face-to-face education, counseling and care coordination.     Ricarda Frameharles Omaya Nieland, MD FACS General Surgeon 08/01/2016, 3:13 PM

## 2016-08-01 NOTE — H&P (Signed)
Patient ID: Denise Pratt, female   DOB: Dec 18, 1970, 45 y.o.   MRN: [id number]  HPI Denise Pratt is a 45 y.o. female admitted for acute cholecystitis. She was seen by my partner in the office secondary to the onset of abdominal pain. She reports that she has been having right upper quadrant and epigastric pain for last several weeks but over the last 3-4 days has intensified getting worse. She describes as sharp and intermittent severe pain now associated with nausea. She denies any jaundice. She does have some chills. She went to the urgent care yesterday and workup included an ultrasound of the right upper quadrant (personally reviewed) showing evidence of a stuck stone gallstone, no evidence of cholecystitis.Marland Kitchen. NML CBD. LFTs were reviewed and there were normal. WBC 10.4. Her symptoms over last 24-48 hours have worsened  HPI  Past Medical History:  Diagnosis Date  . Patient denies medical problems     Past Surgical History:  Procedure Laterality Date  . HYSTEROSCOPY WITH NOVASURE N/A 11/11/2015   Procedure: HYSTEROSCOPY WITH NOVASURE;  Surgeon: Christeen DouglasBethany Beasley, MD;  Location: [facility] ORS;  Service: Gynecology;  Laterality: N/A;  . TUBAL LIGATION      Family History  Problem Relation Age of Onset  . COPD Mother   . Colon cancer Father   . Prostate cancer Father   . Diabetes Father   . Hypertension Father   . Heart attack Sister   . Stroke Maternal Grandmother   . Hypertension Maternal Grandmother     Social History Social History  Substance Use Topics  . Smoking status: Never Smoker  . Smokeless tobacco: Never Used  . Alcohol use Yes     Comment: occ    No Known Allergies  Current Facility-Administered Medications  Medication Dose Route Frequency Provider Last Rate Last Dose  . cefTRIAXone (ROCEPHIN) 2 g in dextrose 5 % 50 mL IVPB  2 g Intravenous Q24H Ricarda Frameharles Woodham, MD      . diphenhydrAMINE (BENADRYL) capsule 25 mg  25 mg Oral Q6H PRN Ricarda Frameharles Woodham, MD       Or  .  diphenhydrAMINE (BENADRYL) injection 25 mg  25 mg Intravenous Q6H PRN Ricarda Frameharles Woodham, MD      . hydrALAZINE (APRESOLINE) injection 10 mg  10 mg Intravenous Q2H PRN Ricarda Frameharles Woodham, MD      . Melene Muller[START ON 08/02/2016] Influenza vac split quadrivalent PF (FLUARIX) injection 0.5 mL  0.5 mL Intramuscular Tomorrow-1000 Diego F Pabon, MD      . iopamidol (ISOVUE-300) 61 % injection 15 mL  15 mL Oral Q1 Hr x 2 Ricarda Frameharles Woodham, MD      . lactated ringers infusion   Intravenous Continuous Ricarda Frameharles Woodham, MD      . morphine 4 MG/ML injection 4 mg  4 mg Intravenous Q4H PRN Ricarda Frameharles Woodham, MD      . ondansetron (ZOFRAN-ODT) disintegrating tablet 4 mg  4 mg Oral Q6H PRN Ricarda Frameharles Woodham, MD       Or  . ondansetron [facility](ZOFRAN) injection 4 mg  4 mg Intravenous Q6H PRN Ricarda Frameharles Woodham, MD      . pantoprazole (PROTONIX) injection 40 mg  40 mg Intravenous QHS Ricarda Frameharles Woodham, MD         Review of Systems A 10 point review of systems was asked and was negative except for the information on the HPI  Physical Exam Blood pressure 120/85, pulse 72, temperature 99.6 F (37.6 C), temperature source Oral, resp. rate 18, height 5\' 3"  (1.6 m),  SpO2 100 %. CONSTITUTIONAL: NAD awake alert.Marland Kitchen. EYES: Pupils are equal, round, and reactive to light, Sclera are non-icteric. EARS, NOSE, MOUTH AND THROAT: The oropharynx is clear. The oral mucosa is pink and moist. Hearing is intact to voice. LYMPH NODES:  Lymph nodes in the neck are normal. RESPIRATORY:  Lungs are clear. There is normal respiratory effort, with equal breath sounds bilaterally, and without pathologic use of accessory muscles. CARDIOVASCULAR: Heart is regular without murmurs, gallops, or rubs. GI: The abdomen is  soft, TTP RUQ + murphy. MUSCULOSKELETAL: Normal muscle strength and tone. No cyanosis or edema.   SKIN: Turgor is good and there are no pathologic skin lesions or ulcers. NEUROLOGIC: Motor and sensation is grossly normal. Cranial nerves are grossly  intact. PSYCH:  Oriented to person, place and time. Affect is normal.  Data Reviewed  I have personally reviewed the patient's imaging, laboratory findings and medical records.    Assessment Plan Persistent right upper quadrant pain and positive Murphy sign consistent with acute cholecystitis. We'll go ahead and start patient on IV antibiotics, IV fluids. We will schedule her for upper scope glossectomy tomorrow. Discussed with the patient in detail.The risks, benefits, complications, treatment options, and expected outcomes were discussed with the patient. The possibilities of bleeding, recurrent infection, finding a normal gallbladder, perforation of viscus organs, damage to surrounding structures, bile leak, abscess formation, needing a drain placed, the need for additional procedures, reaction to medication, pulmonary aspiration,  failure to diagnose a condition, the possible need to convert to an open procedure, and creating a complication requiring transfusion or operation were discussed with the patient. The patient and/or family concurred with the proposed plan, giving informed consent.  Her questions were answered  Sterling Bigiego Pabon, MD FACS General Surgeon 08/01/2016, 5:22 PM

## 2016-08-01 NOTE — Telephone Encounter (Signed)
Per Dr.Woodham -patient is to be direct admitted.  Spoke with Shawn at Direct admit and was told to have patient come to admitting as they had plenty of rooms.  Directions were provided for patient.

## 2016-08-01 NOTE — Patient Instructions (Signed)
Dr.Woodham is Direct admitting you. Please go to [facility] admitting desk and check in. Please call our office if you have any questions or concerns.

## 2016-08-01 NOTE — Progress Notes (Signed)
Dr. 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 Windy Carinaurner,Wang Granada K, RN; 7:54 PM 08/01/2016

## 2016-08-02 ENCOUNTER — Observation Stay: Payer: BLUE CROSS/BLUE SHIELD | Admitting: Anesthesiology

## 2016-08-02 ENCOUNTER — Encounter
Admission: AD | Disposition: A | Discharge status: Home / Self Care | Payer: Self-pay | Source: Ambulatory Visit | Attending: General Surgery

## 2016-08-02 ENCOUNTER — Ambulatory Visit: Payer: Self-pay | Admitting: General Surgery

## 2016-08-02 DIAGNOSIS — K819 Cholecystitis, unspecified: Secondary | ICD-10-CM

## 2016-08-02 DIAGNOSIS — K8010 Calculus of gallbladder with chronic cholecystitis without obstruction: Secondary | ICD-10-CM | POA: Diagnosis not present

## 2016-08-02 HISTORY — PX: CHOLECYSTECTOMY: SHX55

## 2016-08-02 LAB — COMPREHENSIVE METABOLIC PANEL
ALK PHOS: 61 U/L (ref 38–126)
ALT: 17 U/L (ref 14–54)
ANION GAP: 5 (ref 5–15)
AST: 18 U/L (ref 15–41)
Albumin: 3.1 g/dL — ABNORMAL LOW (ref 3.5–5.0)
BILIRUBIN TOTAL: 0.6 mg/dL (ref 0.3–1.2)
BUN: 10 mg/dL (ref 6–20)
CALCIUM: 8.3 mg/dL — AB (ref 8.9–10.3)
CO2: 28 mmol/L (ref 22–32)
CREATININE: 0.71 mg/dL (ref 0.44–1.00)
Chloride: 104 mmol/L (ref 101–111)
GFR calc non Af Amer: >60 mL/min (ref >60)
GLUCOSE: 92 mg/dL (ref 65–99)
Potassium: 3.5 mmol/L (ref 3.5–5.1)
Sodium: 137 mmol/L (ref 135–145)
TOTAL PROTEIN: 6.1 g/dL — AB (ref 6.5–8.1)

## 2016-08-02 LAB — CBC
HEMATOCRIT: 37.4 % (ref 35.0–47.0)
HEMOGLOBIN: 12.6 g/dL (ref 12.0–16.0)
MCH: 30.9 pg (ref 26.0–34.0)
MCHC: 33.7 g/dL (ref 32.0–36.0)
MCV: 91.6 fL (ref 80.0–100.0)
Platelets: 181 10*3/uL (ref 150–440)
RBC: 4.08 MIL/uL (ref 3.80–5.20)
RDW: 13.3 % (ref 11.5–14.5)
WBC: 9.4 10*3/uL (ref 3.6–11.0)

## 2016-08-02 SURGERY — LAPAROSCOPIC CHOLECYSTECTOMY
Anesthesia: General | Site: Abdomen | Wound class: Clean Contaminated

## 2016-08-02 MED ORDER — DEXAMETHASONE SODIUM PHOSPHATE 10 MG/ML IJ SOLN
INTRAMUSCULAR | Status: DC | PRN
  Administered 2016-08-02: 10 mg via INTRAVENOUS

## 2016-08-02 MED ORDER — BUPIVACAINE-EPINEPHRINE 0.25% -1:200000 IJ SOLN
INTRAMUSCULAR | Status: DC | PRN
  Administered 2016-08-02: 30 mL

## 2016-08-02 MED ORDER — KETOROLAC TROMETHAMINE 30 MG/ML IJ SOLN
30.0000 mg | Freq: Four times a day (QID) | INTRAMUSCULAR | Status: DC
  Administered 2016-08-02 – 2016-08-03 (×3): 30 mg via INTRAVENOUS
  Filled 2016-08-02 (×3): qty 1

## 2016-08-02 MED ORDER — LACTATED RINGERS IV SOLN
INTRAVENOUS | Status: DC | PRN
  Administered 2016-08-02: 16:00:00 via INTRAVENOUS

## 2016-08-02 MED ORDER — MIDAZOLAM HCL 2 MG/2ML IJ SOLN
INTRAMUSCULAR | Status: DC | PRN
  Administered 2016-08-02: 2 mg via INTRAVENOUS

## 2016-08-02 MED ORDER — ACETAMINOPHEN 10 MG/ML IV SOLN
INTRAVENOUS | Status: DC | PRN
  Administered 2016-08-02: 1000 mg via INTRAVENOUS

## 2016-08-02 MED ORDER — ONDANSETRON HCL 4 MG/2ML IJ SOLN: 4.0000 mg | Freq: Once | INTRAMUSCULAR | Status: DC | PRN

## 2016-08-02 MED ORDER — LIDOCAINE HCL (CARDIAC) 20 MG/ML IV SOLN
INTRAVENOUS | Status: DC | PRN
  Administered 2016-08-02: 80 mg via INTRAVENOUS

## 2016-08-02 MED ORDER — FENTANYL CITRATE (PF) 100 MCG/2ML IJ SOLN
INTRAMUSCULAR | Status: AC
  Administered 2016-08-02: 25 ug via INTRAVENOUS
  Filled 2016-08-02: qty 2

## 2016-08-02 MED ORDER — PHENYLEPHRINE HCL 10 MG/ML IJ SOLN
INTRAMUSCULAR | Status: DC | PRN
  Administered 2016-08-02: 200 ug via INTRAVENOUS

## 2016-08-02 MED ORDER — MORPHINE SULFATE (PF) 4 MG/ML IV SOLN
4.0000 mg | INTRAVENOUS | Status: DC | PRN
  Administered 2016-08-02 (×3): 4 mg via INTRAVENOUS
  Filled 2016-08-02 (×3): qty 1

## 2016-08-02 MED ORDER — FENTANYL CITRATE (PF) 100 MCG/2ML IJ SOLN
25.0000 ug | INTRAMUSCULAR | Status: DC | PRN
  Administered 2016-08-02 (×5): 25 ug via INTRAVENOUS

## 2016-08-02 MED ORDER — OXYCODONE-ACETAMINOPHEN 7.5-325 MG PO TABS
2.0000 | ORAL_TABLET | ORAL | Status: DC | PRN
  Administered 2016-08-02 – 2016-08-03 (×3): 2 via ORAL
  Filled 2016-08-02 (×3): qty 2

## 2016-08-02 MED ORDER — SUGAMMADEX SODIUM 200 MG/2ML IV SOLN
INTRAVENOUS | Status: DC | PRN
  Administered 2016-08-02: 180 mg via INTRAVENOUS

## 2016-08-02 MED ORDER — CEFTRIAXONE SODIUM-DEXTROSE 2-2.22 GM-% IV SOLR
2.0000 g | INTRAVENOUS | Status: DC
  Filled 2016-08-02: qty 50

## 2016-08-02 MED ORDER — PROPOFOL 10 MG/ML IV BOLUS
INTRAVENOUS | Status: DC | PRN
  Administered 2016-08-02: 170 mg via INTRAVENOUS

## 2016-08-02 MED ORDER — FENTANYL CITRATE (PF) 100 MCG/2ML IJ SOLN
INTRAMUSCULAR | Status: DC | PRN
  Administered 2016-08-02 (×2): 50 ug via INTRAVENOUS

## 2016-08-02 MED ORDER — ROCURONIUM BROMIDE 100 MG/10ML IV SOLN
INTRAVENOUS | Status: DC | PRN
  Administered 2016-08-02: 40 mg via INTRAVENOUS

## 2016-08-02 MED ORDER — FENTANYL CITRATE (PF) 100 MCG/2ML IJ SOLN
INTRAMUSCULAR | Status: AC
  Filled 2016-08-02: qty 2

## 2016-08-02 MED ORDER — SUCRALFATE 1 G PO TABS
2.0000 g | ORAL_TABLET | Freq: Two times a day (BID) | ORAL | Status: DC
  Administered 2016-08-02: 2 g via ORAL
  Filled 2016-08-02: qty 2

## 2016-08-02 MED ORDER — KETOROLAC TROMETHAMINE 30 MG/ML IJ SOLN
INTRAMUSCULAR | Status: DC | PRN
  Administered 2016-08-02: 30 mg via INTRAVENOUS

## 2016-08-02 MED ORDER — ONDANSETRON HCL 4 MG/2ML IJ SOLN
INTRAMUSCULAR | Status: DC | PRN
  Administered 2016-08-02: 4 mg via INTRAVENOUS

## 2016-08-02 SURGICAL SUPPLY — 46 items
APPLICATOR COTTON TIP 6IN STRL (MISCELLANEOUS) ×2 IMPLANT
APPLIER CLIP 5 13 M/L LIGAMAX5 (MISCELLANEOUS) ×2
BLADE SURG 15 STRL LF DISP TIS (BLADE) ×1 IMPLANT
BLADE SURG 15 STRL SS (BLADE) ×1
CANISTER SUCT 1200ML W/VALVE (MISCELLANEOUS) ×2 IMPLANT
CHLORAPREP W/TINT 26ML (MISCELLANEOUS) ×2 IMPLANT
CHOLANGIOGRAM CATH TAUT (CATHETERS) IMPLANT
CLEANER CAUTERY TIP 5X5 PAD (MISCELLANEOUS) ×1 IMPLANT
CLIP APPLIE 5 13 M/L LIGAMAX5 (MISCELLANEOUS) ×1 IMPLANT
DECANTER SPIKE VIAL GLASS SM (MISCELLANEOUS) ×4 IMPLANT
DEVICE TROCAR PUNCTURE CLOSURE (ENDOMECHANICALS) IMPLANT
DRAPE C-ARM XRAY 36X54 (DRAPES) ×2 IMPLANT
ELECT CAUTERY BLADE 6.4 (BLADE) ×2 IMPLANT
ELECT REM PT RETURN 9FT ADLT (ELECTROSURGICAL) ×2
ELECTRODE REM PT RTRN 9FT ADLT (ELECTROSURGICAL) ×1 IMPLANT
ENDOPOUCH RETRIEVER 10 (MISCELLANEOUS) ×2 IMPLANT
GLOVE BIO SURGEON STRL SZ7 (GLOVE) ×2 IMPLANT
GOWN STRL REUS W/ TWL LRG LVL3 (GOWN DISPOSABLE) ×3 IMPLANT
GOWN STRL REUS W/TWL LRG LVL3 (GOWN DISPOSABLE) ×3
IRRIGATION STRYKERFLOW (MISCELLANEOUS) ×1 IMPLANT
IRRIGATOR STRYKERFLOW (MISCELLANEOUS) ×2
IV CATH ANGIO 12GX3 LT BLUE (NEEDLE) ×2 IMPLANT
IV NS 1000ML (IV SOLUTION) ×1
IV NS 1000ML BAXH (IV SOLUTION) ×1 IMPLANT
L-HOOK LAP DISP 36CM (ELECTROSURGICAL) ×2
LHOOK LAP DISP 36CM (ELECTROSURGICAL) ×1 IMPLANT
LIQUID BAND (GAUZE/BANDAGES/DRESSINGS) ×2 IMPLANT
NEEDLE HYPO 22GX1.5 SAFETY (NEEDLE) ×2 IMPLANT
PACK LAP CHOLECYSTECTOMY (MISCELLANEOUS) ×2 IMPLANT
PAD CLEANER CAUTERY TIP 5X5 (MISCELLANEOUS) ×1
PENCIL ELECTRO HAND CTR (MISCELLANEOUS) ×2 IMPLANT
SCISSORS METZENBAUM CVD 33 (INSTRUMENTS) ×2 IMPLANT
SLEEVE ENDOPATH XCEL 5M (ENDOMECHANICALS) ×4 IMPLANT
SOL ANTI-FOG 6CC FOG-OUT (MISCELLANEOUS) ×1 IMPLANT
SOL FOG-OUT ANTI-FOG 6CC (MISCELLANEOUS) ×1
SPONGE LAP 18X18 5 PK (GAUZE/BANDAGES/DRESSINGS) ×2 IMPLANT
STOPCOCK 3 WAY  REPLAC (MISCELLANEOUS) IMPLANT
SUT ETHIBOND 0 MO6 C/R (SUTURE) IMPLANT
SUT MNCRL AB 4-0 PS2 18 (SUTURE) ×2 IMPLANT
SUT VIC AB 0 CT2 27 (SUTURE) IMPLANT
SUT VICRYL 0 AB UR-6 (SUTURE) ×4 IMPLANT
SYR 20CC LL (SYRINGE) ×2 IMPLANT
TROCAR XCEL BLUNT TIP 100MML (ENDOMECHANICALS) ×2 IMPLANT
TROCAR XCEL NON-BLD 5MMX100MML (ENDOMECHANICALS) ×2 IMPLANT
TUBING INSUFFLATOR HI FLOW (MISCELLANEOUS) ×2 IMPLANT
WATER STERILE IRR 1000ML POUR (IV SOLUTION) ×2 IMPLANT

## 2016-08-02 NOTE — Op Note (Signed)
Laparoscopic Cholecystectomy  Pre-operative Diagnosis: Acute  choldcystitis  Post-operative Diagnosis: Same  Procedure: Laparoscopic cholecystectomy  Surgeon: Denise Bigiego Anamika Kueker, MD FACS  Anesthesia: Gen. with endotracheal tube  Findings: Acute Cholecystitis   Estimated Blood Loss: 10cc         Drains: none         Specimens: Gallbladder           Complications: none   Procedure Details  The patient was seen again in the Holding Room. The benefits, complications, treatment options, and expected outcomes were discussed with the patient. The risks of bleeding, infection, recurrence of symptoms, failure to resolve symptoms, bile duct damage, bile duct leak, retained common bile duct stone, bowel injury, any of which could require further surgery and/or ERCP, stent, or papillotomy were reviewed with the patient. The likelihood of improving the patient's symptoms with return to their baseline status is good.  The patient and/or family concurred with the proposed plan, giving informed consent.  The patient was taken to Operating Room, identified as Denise Pratt and the procedure verified as Laparoscopic Cholecystectomy.  A Time Out was held and the above information confirmed.  Prior to the induction of general anesthesia, antibiotic prophylaxis was administered. VTE prophylaxis was in place. General endotracheal anesthesia was then administered and tolerated well. After the induction, the abdomen was prepped with Chloraprep and draped in the sterile fashion. The patient was positioned in the supine position.  Local anesthetic  was injected into the skin near the umbilicus and an incision made. Cut down technique was used to enter the abdominal cavity and a Hasson trochar was placed after two vicryl stitches were anchored to the fascia. Pneumoperitoneum was then created with CO2 and tolerated well without any adverse changes in the patient's vital signs.  Three 5-mm ports were placed in the right  upper quadrant all under direct vision. All skin incisions  were infiltrated with a local anesthetic agent before making the incision and placing the trocars.   The patient was positioned  in reverse Trendelenburg, tilted slightly to the patient's left.  The gallbladder was identified, the fundus grasped and retracted cephalad. Adhesions were lysed bluntly. The infundibulum was grasped and retracted laterally, exposing the peritoneum overlying the triangle of Calot. This was then divided and exposed in a blunt fashion. An extended critical view of the cystic duct and cystic artery was obtained.  The cystic duct was clearly identified and bluntly dissected.   Artery and duct were double clipped and divided.  The gallbladder was taken from the gallbladder fossa in a retrograde fashion with the electrocautery. The gallbladder was removed and placed in an Endocatch bag. The liver bed was irrigated and inspected. Hemostasis was achieved with the electrocautery. Copious irrigation was utilized and was repeatedly aspirated until clear.  The gallbladder and Endocatch sac was then removed   Inspection of the right upper quadrant was performed. No bleeding, bile duct injury or leak, or bowel injury was noted. Pneumoperitoneum was released.  The periumbilical port site was closed with figure-of-eight 0 Vicryl sutures. 4-0 subcuticular Monocryl was used to close the skin. Dermabond was  applied.  The patient was then extubated and brought to the recovery room in stable condition. Sponge, lap, and needle counts were correct at closure and at the conclusion of the case.               Denise Bigiego Aleyssa Pike, MD, FACS

## 2016-08-02 NOTE — Anesthesia Procedure Notes (Signed)
Procedure Name: Intubation Date/Time: 08/02/2016 3:52 PM Performed by: Michaele OfferSAVAGE, Lauree Yurick Pre-anesthesia Checklist: Patient identified, Emergency Drugs available, Suction available, Patient being monitored and Timeout performed Patient Re-evaluated:Patient Re-evaluated prior to inductionOxygen Delivery Method: Circle system utilized Preoxygenation: Pre-oxygenation with 100% oxygen Intubation Type: IV induction Ventilation: Mask ventilation without difficulty Laryngoscope Size: Mac and 3 Grade View: Grade I Tube type: Oral Tube size: 7.0 mm Number of attempts: 1 Airway Equipment and Method: Stylet Placement Confirmation: ETT inserted through vocal cords under direct vision,  positive ETCO2 and breath sounds checked- equal and bilateral Secured at: 21 cm Tube secured with: Tape Dental Injury: Teeth and Oropharynx as per pre-operative assessment

## 2016-08-02 NOTE — Progress Notes (Signed)
Preoperative Informed Consent Review   I have discussed the risks, benefits, and options for this particular procedure with Denise Pratt and family.  She understands the risk for this operation and the alternatives for intervention.  She is in agreement.  She has had her questions answered to her stated satisfaction.  Patient agrees to proceed with this procedure at this time.  Denise Frameharles Woodham MD FACS

## 2016-08-02 NOTE — Anesthesia Preprocedure Evaluation (Signed)
Anesthesia Evaluation  Patient identified by MRN, date of birth, ID band Patient awake    Reviewed: Allergy & Precautions, NPO status , Patient's Chart, lab work & pertinent test results  History of Anesthesia Complications Negative for: history of anesthetic complications  Airway Mallampati: II       Dental   Pulmonary neg pulmonary ROS,           Cardiovascular negative cardio ROS       Neuro/Psych negative neurological ROS     GI/Hepatic Neg liver ROS, GERD  ,  Endo/Other  negative endocrine ROS  Renal/GU negative Renal ROS     Musculoskeletal   Abdominal   Peds  Hematology   Anesthesia Other Findings   Reproductive/Obstetrics                             Anesthesia Physical Anesthesia Plan  ASA: II  Anesthesia Plan: General   Post-op Pain Management:    Induction: Intravenous  Airway Management Planned: Oral ETT  Additional Equipment:   Intra-op Plan:   Post-operative Plan:   Informed Consent: I have reviewed the patients History and Physical, chart, labs and discussed the procedure including the risks, benefits and alternatives for the proposed anesthesia with the patient or authorized representative who has indicated his/her understanding and acceptance.     Plan Discussed with:   Anesthesia Plan Comments:         Anesthesia Quick Evaluation

## 2016-08-02 NOTE — Transfer of Care (Signed)
Immediate Anesthesia Transfer of Care Note  Patient: Denise Pratt  Procedure(s) Performed: Procedure(s): LAPAROSCOPIC CHOLECYSTECTOMY (N/A)  Patient Location: PACU  Anesthesia Type:General  Level of Consciousness: awake, alert , oriented and patient cooperative  Airway & Oxygen Therapy: Patient Spontanous Breathing and Patient connected to face mask oxygen  Post-op Assessment: Report given to RN, Post -op Vital signs reviewed and stable and Patient moving all extremities X 4  Post vital signs: Reviewed and stable  Last Vitals:  Vitals:   08/02/16 0837 08/02/16 1452  BP: 113/67 117/69  Pulse: 64   Resp: 18 18  Temp: 36.7 C 36.8 C    Last Pain:  Vitals:   08/02/16 1456  TempSrc:   PainSc: 4       Patients Stated Pain Goal: 1 (08/02/16 1431)  Complications: No apparent anesthesia complications

## 2016-08-03 ENCOUNTER — Encounter: Payer: Self-pay | Admitting: Surgery

## 2016-08-03 DIAGNOSIS — K8010 Calculus of gallbladder with chronic cholecystitis without obstruction: Secondary | ICD-10-CM | POA: Diagnosis not present

## 2016-08-03 MED ORDER — OXYCODONE-ACETAMINOPHEN 7.5-325 MG PO TABS: 2.0000 | ORAL_TABLET | ORAL | 0 refills | Status: DC | PRN

## 2016-08-03 MED ORDER — ONDANSETRON 4 MG PO TBDP: 4.0000 mg | ORAL_TABLET | Freq: Four times a day (QID) | ORAL | 0 refills | Status: AC | PRN

## 2016-08-03 NOTE — Plan of Care (Signed)
Problem: Pain Managment: Goal: General experience of comfort will improve Outcome: Adequate for Discharge PO pain meds effective overnight.

## 2016-08-03 NOTE — Progress Notes (Signed)
Patient discharge teaching given, including activity, diet, follow-up appoints, and medications. Patient verbalized understanding of all discharge instructions. IV access was d/c'd. Vitals are stable. Skin is intact except as charted in most recent assessments. Pt to be escorted out by volunteer, to be driven home by husband.  Denise Pratt  

## 2016-08-03 NOTE — Discharge Summary (Signed)
  Patient ID: Denise Pratt MRN: [id number] DOB/AGE: 01-Nov-1970 45 y.o.  Admit date: 08/01/2016 Discharge date: 08/03/2016   Discharge Diagnoses:  Active Problems:   Cholecystitis   Procedures:lap chole  Hospital Course: 45 year old obese female admitted for acute cholecystitis and she underwent a laparoscopic cholecystectomy. She had an uneventful postoperative course. At the time of discharge she was ambulating, she was tolerating regular diet and her pain was controlled. Her physical exam at the time of discharge showed an female in no acute distress, awake, alert. Abdomen: Soft, incision healing well without evidence of infection. No peritonitis. Extremities: No edema well perfused. Condition of the patient the time of discharge is stable   Disposition: 01-Home or Self Care  Discharge Instructions    Call MD for:  difficulty breathing, headache or visual disturbances    Complete by:  As directed    Call MD for:  extreme fatigue    Complete by:  As directed    Call MD for:  hives    Complete by:  As directed    Call MD for:  persistant dizziness or light-headedness    Complete by:  As directed    Call MD for:  persistant nausea and vomiting    Complete by:  As directed    Call MD for:  redness, tenderness, or signs of infection (pain, swelling, redness, odor or green/yellow discharge around incision site)    Complete by:  As directed    Call MD for:  severe uncontrolled pain    Complete by:  As directed    Call MD for:  temperature >100.4    Complete by:  As directed    Diet - low sodium heart healthy    Complete by:  As directed    Discharge instructions    Complete by:  As directed    Shower tomorrow, please provide pt with work excuse until F/U appt   Increase activity slowly    Complete by:  As directed    Lifting restrictions    Complete by:  As directed    20 lbs x 6 wks   No wound care    Complete by:  As directed        Medication List    TAKE these  medications   ondansetron 4 MG disintegrating tablet Commonly known as:  ZOFRAN-ODT Take 1 tablet (4 mg total) by mouth every 6 (six) hours as needed for nausea.   oxyCODONE-acetaminophen 7.5-325 MG tablet Commonly known as:  PERCOCET Take 2 tablets by mouth every 4 (four) hours as needed for moderate pain.   sucralfate 1 g tablet Commonly known as:  CARAFATE Take 2 tablets (2 g total) by mouth 2 (two) times daily. An hour before eating.      Follow-up Information    Leafy Roiego F Jessah Danser, MD Follow up in 1 week(s).   Specialty:  General Surgery Contact information: [address] [place] [address] [phone]            Sterling Bigiego Jmarion Christiano, MD FACS

## 2016-08-04 NOTE — Anesthesia Postprocedure Evaluation (Signed)
Anesthesia Post Note  Patient: Denise Pratt  Procedure(s) Performed: Procedure(s) (LRB): LAPAROSCOPIC CHOLECYSTECTOMY (N/A)  Patient location during evaluation: PACU Anesthesia Type: General Level of consciousness: awake and alert and oriented Pain management: pain level controlled Vital Signs Assessment: post-procedure vital signs reviewed and stable Respiratory status: spontaneous breathing Cardiovascular status: blood pressure returned to baseline Anesthetic complications: no    Last Vitals:  Vitals:   08/03/16 0454 08/03/16 0800  BP: 102/70 109/71  Pulse: 69 66  Resp: 18   Temp: 36.8 C 36.4 C    Last Pain:  Vitals:   08/03/16 0858  TempSrc:   PainSc: 4                  Reeshemah Nazaryan

## 2016-08-06 LAB — SURGICAL PATHOLOGY

## 2016-08-07 ENCOUNTER — Telehealth: Payer: Self-pay | Admitting: Surgery

## 2016-08-07 NOTE — Telephone Encounter (Signed)
Ms. Denise Pratt called saying Dr. Everlene FarrierPabon performed Gallbladder surgery on her last Thursday and she's wondering if pain medication can be prescribed to her before her follow-up appointment with him on December 15th, 2017. Please give her a phone call regarding this.  Pt's ph# [phone] Thank you.

## 2016-08-07 NOTE — Telephone Encounter (Signed)
Called patient back to ask how she was feeling. She stated that at times she has some tenderness on her right upper quadrant and wanted to know if she could get a refill on the narcotics that were prescribed. Patient denied having a fever, nausea, vomiting and drainage on her incisions. I recommended for patient to start taking Ibuprofen 800 MG every 8 hours if she has pain. Patient was good with my recommendations and had no further questions. I remind her of her appointment with Dr. Everlene FarrierPabon.

## 2016-08-10 ENCOUNTER — Encounter: Payer: Self-pay | Admitting: Surgery

## 2016-08-10 ENCOUNTER — Ambulatory Visit (INDEPENDENT_AMBULATORY_CARE_PROVIDER_SITE_OTHER): Payer: BLUE CROSS/BLUE SHIELD | Admitting: Surgery

## 2016-08-10 VITALS — BP 132/87 | HR 67 | Temp 97.8°F | Ht 63.0 in | Wt 201.6 lb

## 2016-08-10 DIAGNOSIS — Z09 Encounter for follow-up examination after completed treatment for conditions other than malignant neoplasm: Secondary | ICD-10-CM

## 2016-08-10 NOTE — Progress Notes (Signed)
S/p lap chole  12/7 Doing well Path No CA Taking PO  Some nausea No fevers or chills  PE NAD Abd: incisions c/d/i, no infection No peritonitis  A/P Doing well, w/o complications No heavy lifting RTW excuse given RTC PRN

## 2016-08-10 NOTE — Patient Instructions (Signed)
Please call our office if you have questions or concerns.   

## 2016-08-16 ENCOUNTER — Telehealth: Payer: Self-pay

## 2016-08-16 NOTE — Telephone Encounter (Signed)
Patient's Return to Work Certification was filled out and faxed.

## 2016-09-05 ENCOUNTER — Telehealth: Payer: Self-pay

## 2016-09-05 NOTE — Telephone Encounter (Signed)
Work note printed and placed at [employer] [place] for pick-up.  Return phone call made to patient at this time. No answer. Left voicemail for patient to pick up note in [place] or call back with fax number to send to patient.

## 2016-09-05 NOTE — Telephone Encounter (Signed)
Patient had surgery with Dr. Everlene FarrierPabon on 08/02/2016 and is needing a return back to work letter with no restrictions (or with the restrictions she has). Please call patient and advice.

## 2017-09-24 IMAGING — CT CT ABD-PELV W/ CM
2 of 5 series · 16 of 46 positions shown, 18 images · IV contrast (APPLIED)
Comparison: Ultrasound well [DATE]

CLINICAL DATA: Abdominal pain, right upper quadrant and epigastric
pain for several weeks, worsening over the last 3-4 days. Nausea.

EXAM:
CT ABDOMEN AND PELVIS WITH CONTRAST
TECHNIQUE: Multidetector CT imaging of the abdomen and pelvis was performed
using the standard protocol following bolus administration of
intravenous contrast.
CONTRAST:  75 cc Isovue 370 IV

[Series 2: axial st · axial · 0.89mm/px · z∈[-1167,-712]mm · 13 of 103 slices shown, 15 images]
[im 6/103  soft-tissue]
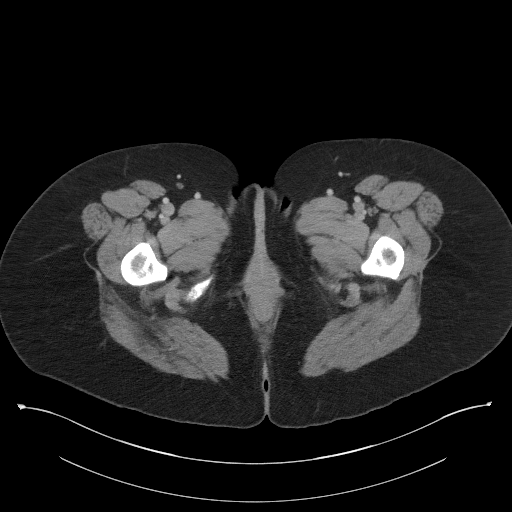
[im 6/103  bone]
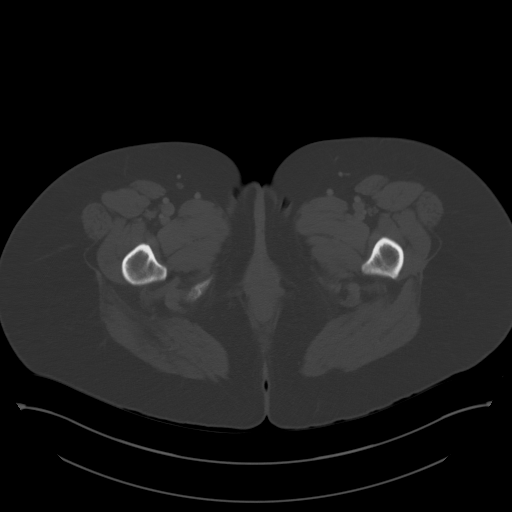
[im 16/103  soft-tissue]
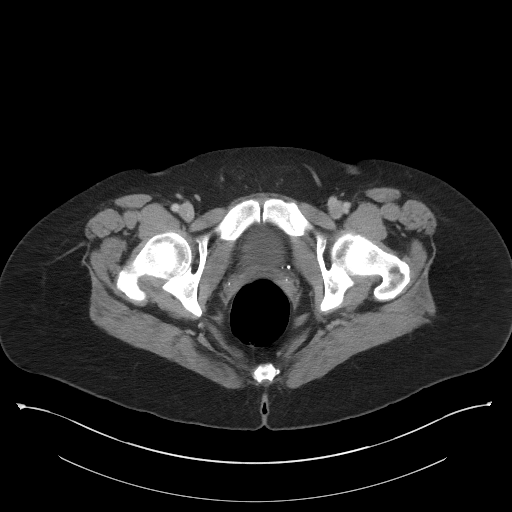
[im 21/103  soft-tissue]
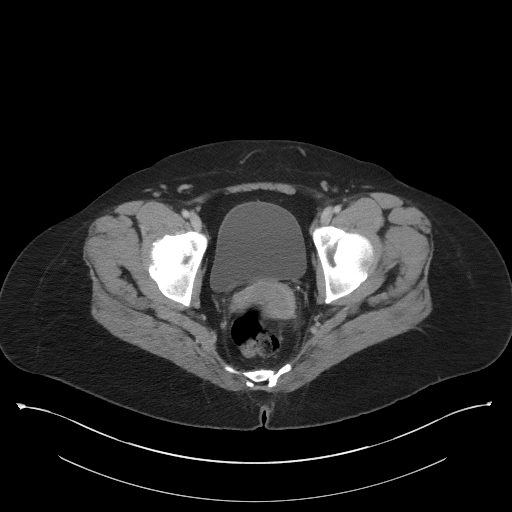
[im 31/103  soft-tissue]
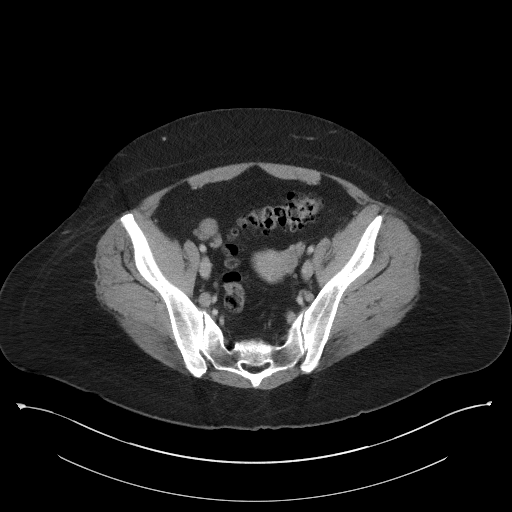
[im 36/103  soft-tissue]
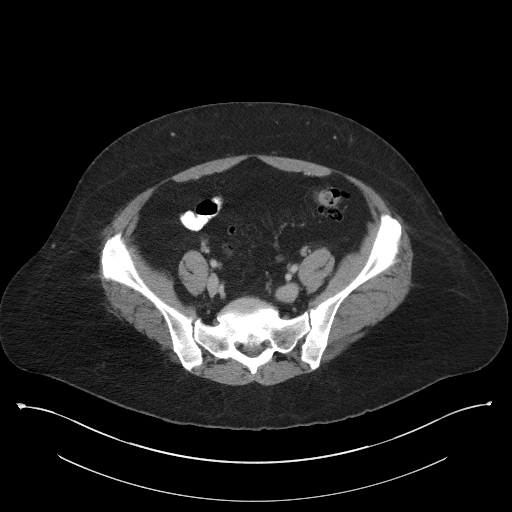
[im 46/103  soft-tissue]
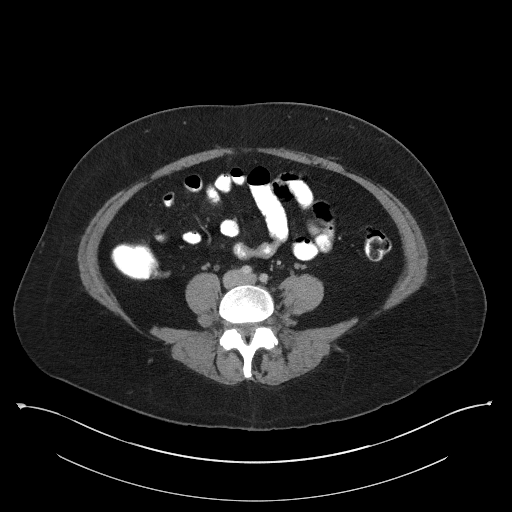
[im 52/103  soft-tissue]
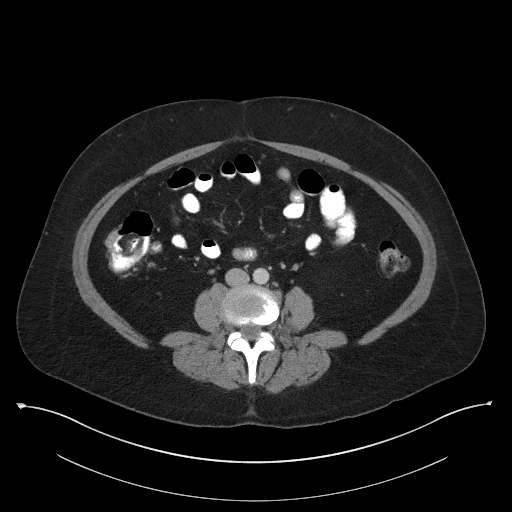
[im 57/103  soft-tissue]
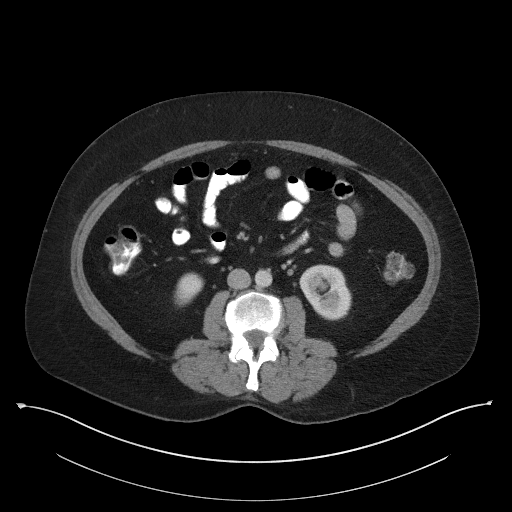
[im 67/103  soft-tissue]
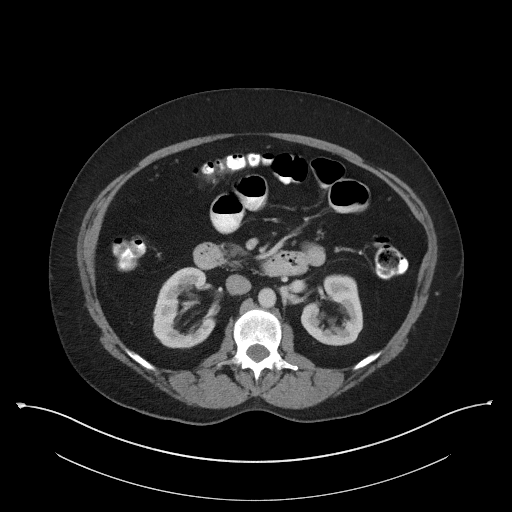
[im 67/103  bone]
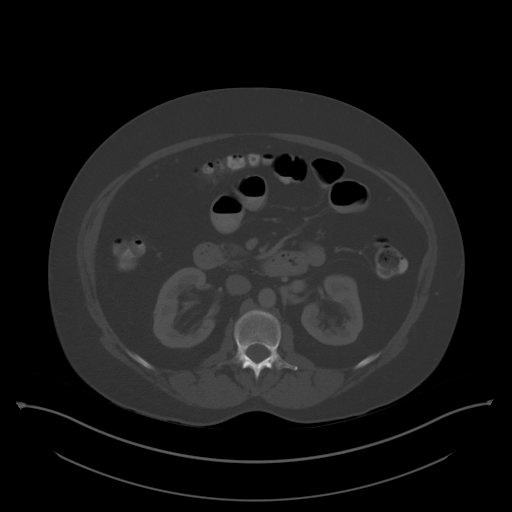
[im 72/103  soft-tissue]
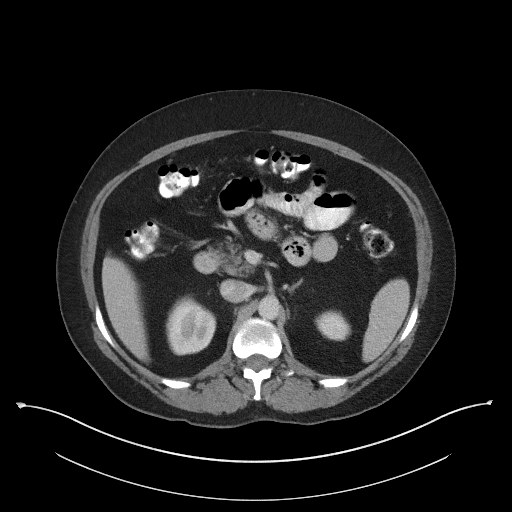
[im 82/103  soft-tissue]
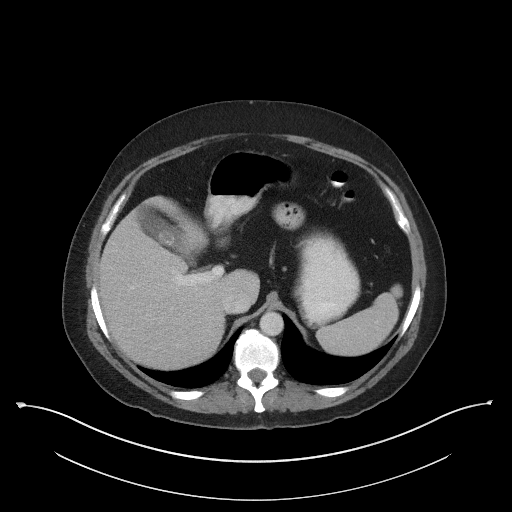
[im 87/103  soft-tissue]
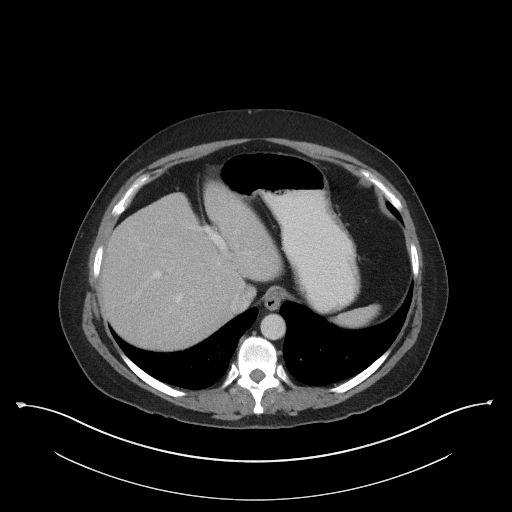
[im 97/103  soft-tissue]
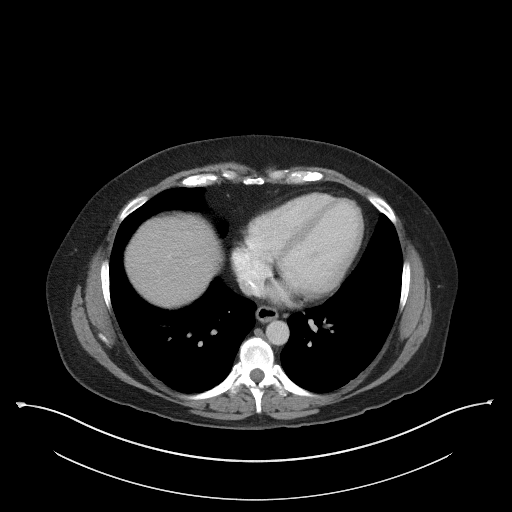

[Series 5: coronal st · coronal · 0.74mm/px · 3 of 99 slices shown]
[im 33/99  soft-tissue]
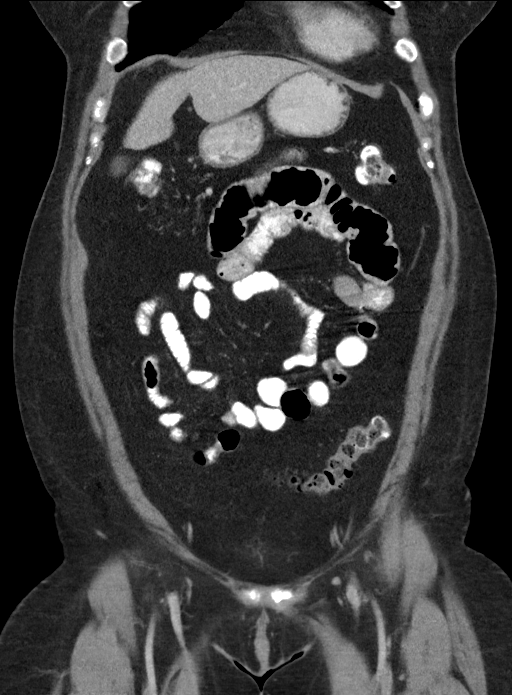
[im 44/99  soft-tissue]
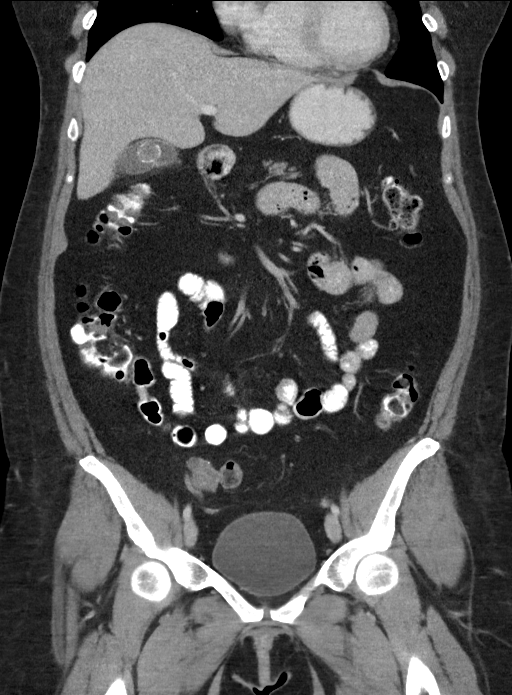
[im 55/99  soft-tissue]
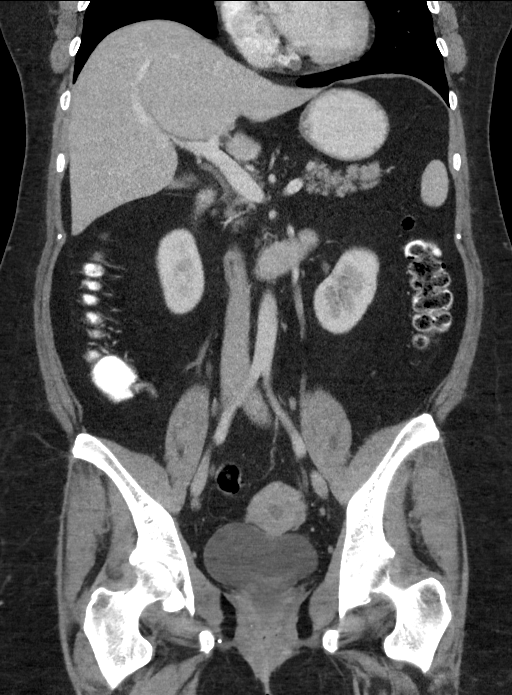

[16 of 46 positions shown; findings below may reference images not displayed]

FINDINGS: Lower chest: Lung bases are clear. No effusions. Heart is normal
size.

Hepatobiliary: 1.8 cm gallstone within the midportion of the
gallbladder. No visible wall thickening or surrounding inflammatory
change. No focal hepatic abnormality.

Pancreas: No focal abnormality or ductal dilatation.

Spleen: No focal abnormality.  Normal size.

Adrenals/Urinary Tract: Small bilateral parapelvic cysts. No
hydronephrosis. Adrenal glands and urinary bladder are unremarkable.

Stomach/Bowel: Appendix is normal. Mildly prominent upper abdominal
small bowel loops without well-defined transition. Contrast noted
throughout the small bowel and colon. Therefore, I do not think this
represents small bowel obstruction, possibly mild focal ileus.
Stomach, large and small bowel grossly unremarkable.

Vascular/Lymphatic: No evidence of aneurysm or adenopathy.

Reproductive: Uterus and adnexa unremarkable.  No mass.

Other: No free fluid or free air.

Musculoskeletal: No acute bony abnormality or focal bone lesion.
IMPRESSION: Cholelithiasis.  No CT evidence for acute cholecystitis.

Normal appendix.

No acute findings.
